# Patient Record
Sex: Male | Born: 1960 | Race: White | Hispanic: No | Marital: Married | Smoking: Former smoker
Health system: Southern US, Community
[De-identification: ages and names within clinical notes are randomized; demographics above are authoritative.]

## PROBLEM LIST (undated history)

## (undated) DIAGNOSIS — C801 Malignant (primary) neoplasm, unspecified: Secondary | ICD-10-CM

## (undated) DIAGNOSIS — I219 Acute myocardial infarction, unspecified: Secondary | ICD-10-CM

## (undated) DIAGNOSIS — E119 Type 2 diabetes mellitus without complications: Secondary | ICD-10-CM

## (undated) DIAGNOSIS — I10 Essential (primary) hypertension: Secondary | ICD-10-CM

## (undated) HISTORY — PX: KIDNEY SURGERY: SHX687

## (undated) HISTORY — PX: KNEE SURGERY: SHX244

## (undated) HISTORY — PX: APPENDECTOMY: SHX54

## (undated) HISTORY — PX: CORONARY ARTERY BYPASS GRAFT: SHX141

---

## 2005-03-25 ENCOUNTER — Ambulatory Visit: Payer: Self-pay | Admitting: Cardiology

## 2005-03-25 ENCOUNTER — Inpatient Hospital Stay (HOSPITAL_COMMUNITY): Admission: AD | Admit: 2005-03-25 | Discharge: 2005-03-27 | Payer: Self-pay | Admitting: Cardiology

## 2005-04-22 ENCOUNTER — Ambulatory Visit: Payer: Self-pay | Admitting: Cardiology

## 2005-06-15 ENCOUNTER — Ambulatory Visit: Payer: Self-pay | Admitting: Cardiology

## 2005-09-09 ENCOUNTER — Ambulatory Visit: Payer: Self-pay | Admitting: Cardiovascular Disease

## 2005-11-11 ENCOUNTER — Inpatient Hospital Stay (HOSPITAL_COMMUNITY): Admission: RE | Admit: 2005-11-11 | Discharge: 2005-11-12 | Payer: Self-pay | Admitting: Cardiology

## 2005-11-11 ENCOUNTER — Ambulatory Visit: Payer: Self-pay | Admitting: Cardiology

## 2005-12-07 ENCOUNTER — Ambulatory Visit: Payer: Self-pay | Admitting: Cardiology

## 2006-03-08 ENCOUNTER — Ambulatory Visit: Payer: Self-pay | Admitting: Cardiovascular Disease

## 2006-03-08 ENCOUNTER — Ambulatory Visit (HOSPITAL_COMMUNITY): Admission: RE | Admit: 2006-03-08 | Discharge: 2006-03-08 | Payer: Self-pay | Admitting: Cardiology

## 2006-03-10 ENCOUNTER — Ambulatory Visit (HOSPITAL_COMMUNITY): Admission: RE | Admit: 2006-03-10 | Discharge: 2006-03-11 | Payer: Self-pay | Admitting: Cardiology

## 2006-03-10 ENCOUNTER — Ambulatory Visit: Payer: Self-pay | Admitting: Cardiology

## 2006-03-28 ENCOUNTER — Ambulatory Visit: Payer: Self-pay | Admitting: Cardiology

## 2006-05-05 ENCOUNTER — Ambulatory Visit: Payer: Self-pay | Admitting: Cardiology

## 2006-05-05 ENCOUNTER — Inpatient Hospital Stay (HOSPITAL_COMMUNITY): Admission: EM | Admit: 2006-05-05 | Discharge: 2006-05-06 | Payer: Self-pay | Admitting: Cardiology

## 2009-12-28 ENCOUNTER — Ambulatory Visit: Payer: Self-pay | Admitting: Surgery

## 2015-03-02 DIAGNOSIS — E785 Hyperlipidemia, unspecified: Secondary | ICD-10-CM | POA: Insufficient documentation

## 2015-03-02 DIAGNOSIS — Z951 Presence of aortocoronary bypass graft: Secondary | ICD-10-CM | POA: Insufficient documentation

## 2015-03-02 DIAGNOSIS — E782 Mixed hyperlipidemia: Secondary | ICD-10-CM | POA: Insufficient documentation

## 2015-03-02 DIAGNOSIS — I214 Non-ST elevation (NSTEMI) myocardial infarction: Secondary | ICD-10-CM | POA: Insufficient documentation

## 2015-03-02 DIAGNOSIS — I2510 Atherosclerotic heart disease of native coronary artery without angina pectoris: Secondary | ICD-10-CM | POA: Insufficient documentation

## 2015-04-30 DIAGNOSIS — I10 Essential (primary) hypertension: Secondary | ICD-10-CM | POA: Insufficient documentation

## 2016-01-28 DIAGNOSIS — G4733 Obstructive sleep apnea (adult) (pediatric): Secondary | ICD-10-CM | POA: Insufficient documentation

## 2016-01-28 DIAGNOSIS — E6609 Other obesity due to excess calories: Secondary | ICD-10-CM | POA: Insufficient documentation

## 2016-11-27 DIAGNOSIS — I200 Unstable angina: Secondary | ICD-10-CM | POA: Insufficient documentation

## 2016-11-27 DIAGNOSIS — E1165 Type 2 diabetes mellitus with hyperglycemia: Secondary | ICD-10-CM | POA: Insufficient documentation

## 2016-11-27 DIAGNOSIS — I209 Angina pectoris, unspecified: Secondary | ICD-10-CM | POA: Insufficient documentation

## 2017-01-04 DIAGNOSIS — K219 Gastro-esophageal reflux disease without esophagitis: Secondary | ICD-10-CM | POA: Insufficient documentation

## 2017-05-01 ENCOUNTER — Ambulatory Visit: Payer: Self-pay | Admitting: Cardiology

## 2018-12-17 ENCOUNTER — Encounter: Payer: Self-pay | Admitting: Emergency Medicine

## 2018-12-17 ENCOUNTER — Encounter: Payer: Self-pay | Admitting: Cardiology

## 2018-12-17 ENCOUNTER — Ambulatory Visit (INDEPENDENT_AMBULATORY_CARE_PROVIDER_SITE_OTHER): Payer: Medicare Other | Admitting: Cardiology

## 2018-12-17 VITALS — BP 112/72 | HR 46 | Ht 67.0 in | Wt 288.6 lb

## 2018-12-17 DIAGNOSIS — I2510 Atherosclerotic heart disease of native coronary artery without angina pectoris: Secondary | ICD-10-CM | POA: Diagnosis not present

## 2018-12-17 DIAGNOSIS — I10 Essential (primary) hypertension: Secondary | ICD-10-CM

## 2018-12-17 DIAGNOSIS — E782 Mixed hyperlipidemia: Secondary | ICD-10-CM

## 2018-12-17 DIAGNOSIS — Z951 Presence of aortocoronary bypass graft: Secondary | ICD-10-CM

## 2018-12-17 NOTE — Progress Notes (Signed)
Cardiology Office Note:    Date:  12/17/2018   ID:  Peter Garrison, DOB [birth year], MRN [id number]  PCP:  Dellia Beckwith, PA-C  Cardiologist:  Jenne Campus, MD    Referring MD: Dellia Beckwith, *   Chief Complaint  Patient presents with  . Follow-up  Doing well  History of Present Illness:    Peter Garrison is a 57 y.o. 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  He is doing well he is working on his weight loss and achieve some success is lost about 20 pounds feeling better and his diabetes is better described to have occasional chest pain when he get upset.  Also he have to pace himself when he is doing things because he get short of breath easily and also sometimes develop chest tightness.  He used to be taking ranolazine but reports the fact that he did not help much.  He did not take nitroglycerin.   No past medical history on file.    Current Medications: Current Meds  Medication Sig  . busPIRone (BUSPAR) 15 MG tablet Take 15 mg by mouth 2 (two) times daily.  . famotidine (PEPCID) 20 MG tablet Take 20 mg by mouth 2 (two) times daily.  . fenofibrate (TRICOR) 48 MG tablet Take 48 mg by mouth daily.  Marland Kitchen glipiZIDE (GLUCOTROL XL) 5 MG 24 hr tablet Take 5 mg by mouth daily.  Marland Kitchen HYDROcodone-acetaminophen (NORCO) 10-325 MG tablet Take 1 tablet by mouth 4 (four) times daily as needed for pain.  Javier Docker Oil 1000 MG CAPS Take 1,000 mg by mouth 2 (two) times daily.  Marland Kitchen lisinopril (PRINIVIL,ZESTRIL) 5 MG tablet Take 5 mg by mouth daily.  Marland Kitchen loratadine (CLARITIN) 10 MG tablet Take 10 mg by mouth daily.  . metoprolol tartrate (LOPRESSOR) 25 MG tablet Take 25 mg by mouth 2 (two) times daily.  . nitroGLYCERIN (NITROSTAT) 0.4 MG SL tablet Place 0.4 mg under the tongue as needed for chest pain.  . rosuvastatin (CRESTOR) 20 MG tablet Take 1 tablet by mouth  daily.  . ticagrelor (BRILINTA) 90 MG TABS tablet Take 90 mg by mouth 2 (two) times daily.     Allergies:   Adhesive [tape]; Percocet [oxycodone-acetaminophen]; and Prednisone   Social History   Socioeconomic History  . Marital status: Married    Spouse name: Not on file  . Number of children: Not on file  . Years of education: Not on file  . Highest education level: Not on file  Occupational History  . Not on file  Social Needs  . Financial resource strain: Not on file  . Food insecurity:    Worry: Not on file    Inability: Not on file  . Transportation needs:    Medical: Not on file    Non-medical: Not on file  Tobacco Use  . Smoking status: Former Research scientist (life sciences)  . Smokeless tobacco: Current User    Types: Snuff  Substance and Sexual Activity  . Alcohol use: Not Currently  . Drug use: Never  . Sexual activity: Not on file  Lifestyle  . Physical activity:    Days per week: Not on file    Minutes per session: Not on file  . Stress: Not on file  Relationships  . Social connections:    Talks on phone: Not on file    Gets together: Not on file  Attends religious service: Not on file    Active member of club or organization: Not on file    Attends meetings of clubs or organizations: Not on file    Relationship status: Not on file  Other Topics Concern  . Not on file  Social History Narrative  . Not on file     Family History: The patient's family history includes Aneurysm in his mother; Cancer in his father; Diabetes in his mother. ROS:   Please see the history of present illness.    All 14 point review of systems negative except as described per history of present illness  EKGs/Labs/Other Studies Reviewed:      Recent Labs: No results found for requested labs within last 8760 hours.  Recent Lipid Panel No results found for: CHOL, TRIG, HDL, CHOLHDL, VLDL, LDLCALC, LDLDIRECT  Physical Exam:    VS:  BP 112/72   Pulse (!) 46   Ht 5\' 7"  (1.702 m)   Wt 288 lb  9.6 oz (130.9 kg)   SpO2 98%   BMI 45.20 kg/m     Wt Readings from Last 3 Encounters:  12/17/18 288 lb 9.6 oz (130.9 kg)     GEN:  Well nourished, well developed in no acute distress HEENT: Normal NECK: No JVD; No carotid bruits LYMPHATICS: No lymphadenopathy CARDIAC: RRR, no murmurs, no rubs, no gallops RESPIRATORY:  Clear to auscultation without rales, wheezing or rhonchi  ABDOMEN: Soft, non-tender, non-distended MUSCULOSKELETAL:  No edema; No deformity  SKIN: Warm and dry LOWER EXTREMITIES: no swelling NEUROLOGIC:  Alert and oriented x 3 PSYCHIATRIC:  Normal affect   ASSESSMENT:    1. S/P CABG (coronary artery bypass graft)   2. Mixed hyperlipidemia   3. Benign essential hypertension   4. Coronary artery disease involving native coronary artery of native heart without angina pectoris    PLAN:    In order of problems listed above:  1. Coronary artery disease status post coronary artery bypass graft still have some symptoms therefore as a part of evaluation I will ask him to have stress test done.  He still taking Brilinta which is more than 2 years after stent implantation ask him to stop it I will put him on Plavix 75.  I will make sure he get nitroglycerin he knows has to use it. 2. Dyslipidemia will call primary care physician to get his fasting lipid profile according to the patient was excellent.  We will get a copy of it 3.  4. Essential hypertension blood pressure well controlled continue present medications. 5. Dyspnea on exertion and fatigue I will ask him to have an echocardiogram to assess left ventricular ejection fraction.  EKG will be done today as well   Medication Adjustments/Labs and Tests Ordered: Current medicines are reviewed at length with the patient today.  Concerns regarding medicines are outlined above.  No orders of the defined types were placed in this encounter.  Medication changes: No orders of the defined types were placed in this  encounter.   Signed, Park Liter, MD, [facility] 12/17/2018 10:20 AM    [place]

## 2018-12-17 NOTE — Patient Instructions (Signed)
Medication Instructions:  Your physician recommends that you continue on your current medications as directed. Please refer to the Current Medication list given to you today.  If you need a refill on your cardiac medications before your next appointment, please call your pharmacy.   Lab work: None.  If you have labs (blood work) drawn today and your tests are completely normal, you will receive your results only by: Marland Kitchen MyChart Message (if you have MyChart) OR . A paper copy in the mail If you have any lab test that is abnormal or we need to change your treatment, we will call you to review the results.  Testing/Procedures: Your physician has requested that you have an echocardiogram. Echocardiography is a painless test that uses sound waves to create images of your heart. It provides your doctor with information about the size and shape of your heart and how well your heart's chambers and valves are working. This procedure takes approximately one hour. There are no restrictions for this procedure.  Your physician has requested that you have en exercise stress myoview. For further information please visit [email or web address]. Please follow instruction sheet, as given.    Follow-Up: At [facility], you and your health needs are our priority.  As part of our continuing mission to provide you with exceptional heart care, we have created designated Provider Care Teams.  These Care Teams include your primary Cardiologist (physician) and Advanced Practice Providers (APPs -  Physician Assistants and Nurse Practitioners) who all work together to provide you with the care you need, when you need it. You will need a follow up appointment in 6 weeks.  Please call our office 2 months in advance to schedule this appointment.  You may see No primary care provider on file. or another member of our [employer] Provider Team in [place]: Shirlee More, MD . Jyl Heinz, MD  Any Other Special  Instructions Will Be Listed Below (If Applicable).   Echocardiogram An echocardiogram is a procedure that uses painless sound waves (ultrasound) to produce an image of the heart. Images from an echocardiogram can provide important information about:  Signs of coronary artery disease (CAD).  Aneurysm detection. An aneurysm is a weak or damaged part of an artery wall that bulges out from the normal force of blood pumping through the body.  Heart size and shape. Changes in the size or shape of the heart can be associated with certain conditions, including heart failure, aneurysm, and CAD.  Heart muscle function.  Heart valve function.  Signs of a past heart attack.  Fluid buildup around the heart.  Thickening of the heart muscle.  A tumor or infectious growth around the heart valves. Tell a health care provider about:  Any allergies you have.  All medicines you are taking, including vitamins, herbs, eye drops, creams, and over-the-counter medicines.  Any blood disorders you have.  Any surgeries you have had.  Any medical conditions you have.  Whether you are pregnant or may be pregnant. What are the risks? Generally, this is a safe procedure. However, problems may occur, including:  Allergic reaction to dye (contrast) that may be used during the procedure. What happens before the procedure? No specific preparation is needed. You may eat and drink normally. What happens during the procedure?   An IV tube may be inserted into one of your veins.  You may receive contrast through this tube. A contrast is an injection that improves the quality of the pictures from your heart.  A gel will be applied to your chest.  A wand-like tool (transducer) will be moved over your chest. The gel will help to transmit the sound waves from the transducer.  The sound waves will harmlessly bounce off of your heart to allow the heart images to be captured in real-time motion. The images  will be recorded on a computer. The procedure may vary among health care providers and hospitals. What happens after the procedure?  You may return to your normal, everyday life, including diet, activities, and medicines, unless your health care provider tells you not to do that. Summary  An echocardiogram is a procedure that uses painless sound waves (ultrasound) to produce an image of the heart.  Images from an echocardiogram can provide important information about the size and shape of your heart, heart muscle function, heart valve function, and fluid buildup around your heart.  You do not need to do anything to prepare before this procedure. You may eat and drink normally.  After the echocardiogram is completed, you may return to your normal, everyday life, unless your health care provider tells you not to do that. This information is not intended to replace advice given to you by your health care provider. Make sure you discuss any questions you have with your health care provider. Document Released: 09/23/2000 Document Revised: 10/29/2016 Document Reviewed: 10/29/2016 Elsevier Interactive Patient Education  2019 [employer].   Cardiac Nuclear Scan A cardiac nuclear scan is a test that measures blood flow to the heart when a person is resting and when he or she is exercising. The test looks for problems such as:  Not enough blood reaching a portion of the heart.  The heart muscle not working normally. You may need this test if:  You have heart disease.  You have had abnormal lab results.  You have had heart surgery or a balloon procedure to open up blocked arteries (angioplasty).  You have chest pain.  You have shortness of breath. In this test, a radioactive dye (tracer) is injected into your bloodstream. After the tracer has traveled to your heart, an imaging device is used to measure how much of the tracer is absorbed by or distributed to various areas of your heart.  This procedure is usually done at a hospital and takes 2-4 hours. Tell a health care provider about:  Any allergies you have.  All medicines you are taking, including vitamins, herbs, eye drops, creams, and over-the-counter medicines.  Any problems you or family members have had with anesthetic medicines.  Any blood disorders you have.  Any surgeries you have had.  Any medical conditions you have.  Whether you are pregnant or may be pregnant. What are the risks? Generally, this is a safe procedure. However, problems may occur, including:  Serious chest pain and heart attack. This is only a risk if the stress portion of the test is done.  Rapid heartbeat.  Sensation of warmth in your chest. This usually passes quickly.  Allergic reaction to the tracer. What happens before the procedure?  Ask your health care provider about changing or stopping your regular medicines. This is especially important if you are taking diabetes medicines or blood thinners.  Follow instructions from your health care provider about eating or drinking restrictions.  Remove your jewelry on the day of the procedure. What happens during the procedure?  An IV will be inserted into one of your veins.  Your health care provider will inject a small amount of radioactive  tracer through the IV.  You will wait for 20-40 minutes while the tracer travels through your bloodstream.  Your heart activity will be monitored with an electrocardiogram (ECG).  You will lie down on an exam table.  Images of your heart will be taken for about 15-20 minutes.  You may also have a stress test. For this test, one of the following may be done: ? You will exercise on a treadmill or stationary bike. While you exercise, your heart's activity will be monitored with an ECG, and your blood pressure will be checked. ? You will be given medicines that will increase blood flow to parts of your heart. This is done if you are unable  to exercise.  When blood flow to your heart has peaked, a tracer will again be injected through the IV.  After 20-40 minutes, you will get back on the exam table and have more images taken of your heart.  Depending on the type of tracer used, scans may need to be repeated 3-4 hours later.  Your IV line will be removed when the procedure is over. The procedure may vary among health care providers and hospitals. What happens after the procedure?  Unless your health care provider tells you otherwise, you may return to your normal schedule, including diet, activities, and medicines.  Unless your health care provider tells you otherwise, you may increase your fluid intake. This will help to flush the contrast dye from your body. Drink enough fluid to keep your urine pale yellow.  Ask your health care provider, or the department that is doing the test: ? When will my results be ready? ? How will I get my results? Summary  A cardiac nuclear scan measures the blood flow to the heart when a person is resting and when he or she is exercising.  Tell your health care provider if you are pregnant.  Before the procedure, ask your health care provider about changing or stopping your regular medicines. This is especially important if you are taking diabetes medicines or blood thinners.  After the procedure, unless your health care provider tells you otherwise, increase your fluid intake. This will help flush the contrast dye from your body.  After the procedure, unless your health care provider tells you otherwise, you may return to your normal schedule, including diet, activities, and medicines. This information is not intended to replace advice given to you by your health care provider. Make sure you discuss any questions you have with your health care provider. Document Released: 10/21/2004 Document Revised: 03/12/2018 Document Reviewed: 03/12/2018 Elsevier Interactive Patient Education  2019  [employer].

## 2019-01-03 ENCOUNTER — Telehealth: Payer: Self-pay | Admitting: Cardiology

## 2019-01-03 ENCOUNTER — Telehealth: Payer: Self-pay | Admitting: *Deleted

## 2019-01-03 NOTE — Telephone Encounter (Signed)
Patient called and state that wife should be added to HIPPA form which he did not do when he was in the office on original appt date. Imailed a copy of the DPR to patient int he mail and he will send back to [place] for updating.

## 2019-01-03 NOTE — Telephone Encounter (Signed)
Cancelled nuclear appt 

## 2019-01-15 ENCOUNTER — Ambulatory Visit: Payer: Medicare Other

## 2019-01-16 ENCOUNTER — Ambulatory Visit: Payer: Medicare Other

## 2019-01-17 ENCOUNTER — Other Ambulatory Visit: Payer: Medicare Other

## 2019-01-22 NOTE — Telephone Encounter (Signed)
2 day Lexiscan placed into recall

## 2019-01-28 ENCOUNTER — Telehealth: Payer: Self-pay | Admitting: Cardiology

## 2019-01-28 NOTE — Telephone Encounter (Signed)
Virtual Visit Pre-Appointment Phone Call  Steps For Call:  1. Confirm consent - "In the setting of the current Covid19 crisis, you are scheduled for a (phone or video) visit with your provider on (date) at (time).  Just as we do with many in-office visits, in order for you to participate in this visit, we must obtain consent.  If you'd like, I can send this to your mychart (if signed up) or email for you to review.  Otherwise, I can obtain your verbal consent now.  All virtual visits are billed to your insurance company just like a normal visit would be.  By agreeing to a virtual visit, we'd like you to understand that the technology does not allow for your provider to perform an examination, and thus may limit your provider's ability to fully assess your condition. If your provider identifies any concerns that need to be evaluated in person, we will make arrangements to do so.  Finally, though the technology is pretty good, we cannot assure that it will always work on either your or our end, and in the setting of a video visit, we may have to convert it to a phone-only visit.  In either situation, we cannot ensure that we have a secure connection.  Are you willing to proceed?" STAFF: Did the patient verbally acknowledge consent to telehealth visit? Document YES/NO here: YES  2. Confirm the BEST phone number to call the day of the visit by including in appointment notes  3. Give patient instructions for WebEx/MyChart download to smartphone as below or Doximity/Doxy.me if video visit (depending on what platform provider is using)  4. Advise patient to be prepared with their blood pressure, heart rate, weight, any heart rhythm information, their current medicines, and a piece of paper and pen handy for any instructions they may receive the day of their visit  5. Inform patient they will receive a phone call 15 minutes prior to their appointment time (may be from unknown caller ID) so they should be  prepared to answer  6. Confirm that appointment type is correct in Epic appointment notes (VIDEO vs PHONE)     TELEPHONE CALL NOTE  Peter Garrison has been deemed a candidate for a follow-up tele-health visit to limit community exposure during the Covid-19 pandemic. I spoke with the patient via phone to ensure availability of phone/video source, confirm preferred email & phone number, and discuss instructions and expectations.  I reminded Peter Garrison to be prepared with any vital sign and/or heart rhythm information that could potentially be obtained via home monitoring, at the time of his visit. I reminded Peter Garrison to expect a phone call at the time of his visit if his visit.  Isaiah Blakes 01/28/2019 10:04 AM   INSTRUCTIONS FOR DOWNLOADING THE WEBEX APP TO SMARTPHONE  - If Apple, ask patient to go to [employer] and type in WebEx in the search bar. [place] [employer], the blue/green circle. If Android, go to [employer] and type in [employer] in the search bar. The app is free but as with any other app downloads, their phone may require them to verify saved payment information or Apple/Android password.  - The patient does NOT have to create an account. - On the day of the visit, the assist will walk the patient through joining the meeting with the meeting number/password.  INSTRUCTIONS FOR DOWNLOADING THE MYCHART APP TO SMARTPHONE  - The patient must first make sure to have  activated MyChart and know their login information - If Apple, go to [employer] and type in MyChart in the search bar and download the app. If Android, ask patient to go to [employer] and type in [place] in the search bar and download the app. The app is free but as with any other app downloads, their phone may require them to verify saved payment information or Apple/Android password.  - The patient will need to then log into the app with their MyChart username and password, and select [facility] as their healthcare provider to link the account. When it is time for your visit, go to the MyChart app, find appointments, and click Begin Video Visit. Be sure to Select Allow for your device to access the Microphone and Camera for your visit. You will then be connected, and your provider will be with you shortly.  **If they have any issues connecting, or need assistance please contact MyChart service desk [phone])**  **If using a computer, in order to ensure the best quality for their visit they will need to use either of the following Internet Browsers: [employer], or Google Chrome**  IF USING DOXIMITY or DOXY.ME - The patient will receive a link just prior to their visit, either by text or email (to be determined day of appointment depending on if it's doxy.me or Doximity).     FULL LENGTH CONSENT FOR TELE-HEALTH VISIT   I hereby voluntarily request, consent and authorize [place] and its employed or contracted physicians, physician assistants, nurse practitioners or other licensed health care professionals (the Practitioner), to provide me with telemedicine health care services (the Services") as deemed necessary by the treating Practitioner. I acknowledge and consent to receive the Services by the Practitioner via telemedicine. I understand that the telemedicine visit will involve communicating with the Practitioner through live audiovisual communication technology and the disclosure of certain medical information by electronic transmission. I acknowledge that I have been given the opportunity to request an in-person assessment or other available alternative prior to the telemedicine visit and am voluntarily participating in the telemedicine visit.  I understand that I have the right to withhold or withdraw my consent to the use of telemedicine in the course of my care at any time, without affecting my right to future care or treatment, and that the  Practitioner or I may terminate the telemedicine visit at any time. I understand that I have the right to inspect all information obtained and/or recorded in the course of the telemedicine visit and may receive copies of available information for a reasonable fee.  I understand that some of the potential risks of receiving the Services via telemedicine include:   Delay or interruption in medical evaluation due to technological equipment failure or disruption;  Information transmitted may not be sufficient (e.g. poor resolution of images) to allow for appropriate medical decision making by the Practitioner; and/or   In rare instances, security protocols could fail, causing a breach of personal health information.  Furthermore, I acknowledge that it is my responsibility to provide information about my medical history, conditions and care that is complete and accurate to the best of my ability. I acknowledge that Practitioner's advice, recommendations, and/or decision may be based on factors not within their control, such as incomplete or inaccurate data provided by me or distortions of diagnostic images or specimens that may result from electronic transmissions. I understand that the practice of medicine is not an exact science and that  Practitioner makes no warranties or guarantees regarding treatment outcomes. I acknowledge that I will receive a copy of this consent concurrently upon execution via email to the email address I last provided but may also request a printed copy by calling the office of [place].    I understand that my insurance will be billed for this visit.   I have read or had this consent read to me.  I understand the contents of this consent, which adequately explains the benefits and risks of the Services being provided via telemedicine.   I have been provided ample opportunity to ask questions regarding this consent and the Services and have had my questions answered to my  satisfaction.  I give my informed consent for the services to be provided through the use of telemedicine in my medical care  By participating in this telemedicine visit I agree to the above.

## 2019-02-04 ENCOUNTER — Other Ambulatory Visit: Payer: Self-pay

## 2019-02-04 ENCOUNTER — Telehealth (INDEPENDENT_AMBULATORY_CARE_PROVIDER_SITE_OTHER): Payer: Medicare Other | Admitting: Cardiology

## 2019-02-04 ENCOUNTER — Telehealth: Payer: Self-pay | Admitting: Cardiology

## 2019-02-04 ENCOUNTER — Encounter: Payer: Self-pay | Admitting: Cardiology

## 2019-02-04 DIAGNOSIS — I2510 Atherosclerotic heart disease of native coronary artery without angina pectoris: Secondary | ICD-10-CM

## 2019-02-04 DIAGNOSIS — Z951 Presence of aortocoronary bypass graft: Secondary | ICD-10-CM

## 2019-02-04 DIAGNOSIS — E782 Mixed hyperlipidemia: Secondary | ICD-10-CM

## 2019-02-04 DIAGNOSIS — E1165 Type 2 diabetes mellitus with hyperglycemia: Secondary | ICD-10-CM

## 2019-02-04 DIAGNOSIS — I10 Essential (primary) hypertension: Secondary | ICD-10-CM

## 2019-02-04 NOTE — Progress Notes (Signed)
Virtual Visit via Video Note   This visit type was conducted due to national recommendations for restrictions regarding the COVID-19 Pandemic (e.g. social distancing) in an effort to limit this patient's exposure and mitigate transmission in our community.  Due to his co-morbid illnesses, this patient is at least at moderate risk for complications without adequate follow up.  This format is felt to be most appropriate for this patient at this time.  All issues noted in this document were discussed and addressed.  A limited physical exam was performed with this format.  Please refer to the patient's chart for his consent to telehealth for [facility].  Evaluation Performed:  Follow-up visit  This visit type was conducted due to national recommendations for restrictions regarding the COVID-19 Pandemic (e.g. social distancing).  This format is felt to be most appropriate for this patient at this time.  All issues noted in this document were discussed and addressed.  No physical exam was performed (except for noted visual exam findings with Video Visits).  Please refer to the patient's chart (MyChart message for video visits and phone note for telephone visits) for the patient's consent to telehealth for [facility].  Date:  02/04/2019  ID: Peter Garrison, DOB [birth year], MRN [id number]   Patient Location: [place] [address]   Provider location:   [place] [facility]  PCP:  Dellia Beckwith, PA-C  Cardiologist:  Jenne Campus, MD     Chief Complaint: Doing well  History of Present Illness:    Peter Garrison is a 58 y.o. male  who presents via audio/video conferencing for a telehealth visit today.  With coronary disease, status post remote coronary artery bypass graft, dyslipidemia, diabetes.  We having video visit today to talk about his problem overall he is doing well he sits at home majority of time.  He is keeping restriction of coronavirus.  He  described to have some fatigue and tiredness while walking.  But was able to cut grass using a riding lawnmower last week and he had no difficulty doing it.  No chest pain no tightness no pressure no burning no squeezing in the chest no palpitations no dizziness.   The patient does not have symptoms concerning for COVID-19 infection (fever, chills, cough, or new SHORTNESS OF BREATH).    Prior CV studies:   The following studies were reviewed today:       No past medical history on file.     Current Meds  Medication Sig  . busPIRone (BUSPAR) 15 MG tablet Take 15 mg by mouth 2 (two) times daily.  . famotidine (PEPCID) 20 MG tablet Take 20 mg by mouth 2 (two) times daily.  . fenofibrate (TRICOR) 48 MG tablet Take 48 mg by mouth daily.  Marland Kitchen glipiZIDE (GLUCOTROL XL) 5 MG 24 hr tablet Take 5 mg by mouth daily.  Marland Kitchen HYDROcodone-acetaminophen (NORCO) 10-325 MG tablet Take 1 tablet by mouth 4 (four) times daily as needed for pain.  Javier Docker Oil 1000 MG CAPS Take 1,000 mg by mouth 2 (two) times daily.  Marland Kitchen lisinopril (PRINIVIL,ZESTRIL) 5 MG tablet Take 5 mg by mouth daily.  Marland Kitchen loratadine (CLARITIN) 10 MG tablet Take 10 mg by mouth daily.  . metoprolol tartrate (LOPRESSOR) 25 MG tablet Take 25 mg by mouth 2 (two) times daily.  . nitroGLYCERIN (NITROSTAT) 0.4 MG SL tablet Place 0.4 mg under the tongue as needed for chest pain.  . rosuvastatin (CRESTOR) 20 MG tablet  Take 1 tablet by mouth daily.  . ticagrelor (BRILINTA) 90 MG TABS tablet Take 90 mg by mouth 2 (two) times daily.      Family History: The patient's family history includes Aneurysm in his mother; Cancer in his father; Diabetes in his mother.   ROS:   Please see the history of present illness.     All other systems reviewed and are negative.   Labs/Other Tests and Data Reviewed:     Recent Labs: No results found for requested labs within last 8760 hours.  Recent Lipid Panel No results found for: CHOL, TRIG, HDL, CHOLHDL,  VLDL, LDLCALC, LDLDIRECT    Exam:    Vital Signs:  There were no vitals taken for this visit.    Wt Readings from Last 3 Encounters:  12/17/18 288 lb 9.6 oz (130.9 kg)     Well nourished, well developed in no acute distress. Alert awake oriented x3.  Talking to me over a video link he sitting in his living room.  Happy to be able to talk to me not in any distress no JVD no swelling of lower extremities  Diagnosis for this visit:   1. S/P CABG (coronary artery bypass graft)   2. Mixed hyperlipidemia   3. Type 2 diabetes mellitus with hyperglycemia, without long-term current use of insulin ([place])   4. Benign essential hypertension   5. Coronary artery disease involving native coronary artery of native heart without angina pectoris   6.  Coronary artery disease.  Noted.  Plan as outlined above   ASSESSMENT & PLAN:    1.  Status post coronary artery bypass graft looking good from that point review he supposed to have a stress test done because of exertional shortness of breath as well as echocardiogram.  I waiting for coronavirus situation to stabilize before those tests will be done. 2.  Mixed dyslipidemia he is taking Crestor 20 which I will continue.  In the future we will recheck his fasting lipid profile. 3.  Type 2 diabetes stable followed by internal medicine team. 4.  Benign essential hypertension blood pressure well controlled with appropriate medication which I will continue.  COVID-19 Education: The signs and symptoms of COVID-19 were discussed with the patient and how to seek care for testing (follow up with PCP or arrange E-visit).  The importance of social distancing was discussed today.  Patient Risk:   After full review of this patients clinical status, I feel that they are at least moderate risk at this time.  Time:   Today, I have spent 14 minutes with the patient with telehealth technology discussing pt health issues.  I spent 5 minutes reviewing her chart before  the visit.  Visit was finished at 11:25 AM.    Medication Adjustments/Labs and Tests Ordered: Current medicines are reviewed at length with the patient today.  Concerns regarding medicines are outlined above.  No orders of the defined types were placed in this encounter.  Medication changes: No orders of the defined types were placed in this encounter.    Disposition: Follow-up in 3 months.  Echocardiogram and stress test will be put in acuity level 2  Signed, Park Liter, MD, [facility] 02/04/2019 11:24 AM    [place]

## 2019-02-04 NOTE — Telephone Encounter (Signed)

## 2019-02-04 NOTE — Patient Instructions (Addendum)
Medication Instructions:  Your physician recommends that you continue on your current medications as directed. Please refer to the Current Medication list given to you today.  If you need a refill on your cardiac medications before your next appointment, please call your pharmacy.   Lab work: None If you have labs (blood work) drawn today and your tests are completely normal, you will receive your results only by:  [place] (if you have MyChart) OR  A paper copy in the mail If you have any lab test that is abnormal or we need to change your treatment, we will call you to review the results.  Testing/Procedures: Your physician has requested that you have an echocardiogram. Echocardiography is a painless test that uses sound waves to create images of your heart. It provides your doctor with information about the size and shape of your heart and how well your hearts chambers and valves are working. This procedure takes approximately one hour. There are no restrictions for this procedure.  Your physician has requested that you have a stress echocardiogram. For further information please visit [email or web address]. Please follow instruction sheet as given. DO NOT TAKE GLIPIZIDE OR METOPROLOL THE DAY OF THE TEST DO NOT EAT OR DRINK OR CONSUME TOBACCO PRODUCTS 4 HOURS PRIOR TO TEST DRESS PREPARED TO EXERCISE IN COMFORTABLE 2 PIECE CLOTHING AND WALKING SHOES BRING CURRENT MEDS TO THE APPOINTMENT NOTIFY THE OFFICE 24 HOURS IN ADVANCE IF UNABLE TO MAKE APPOINTMENT  Follow-Up: At [facility], you and your health needs are our priority.  As part of our continuing mission to provide you with exceptional heart care, we have created designated Provider Care Teams.  These Care Teams include your primary Cardiologist (physician) and Advanced Practice Providers (APPs -  Physician Assistants and Nurse Practitioners) who all work together to provide you with the care you need, when you need it. You  will need a follow up appointment in 3 months.  Any Other Special Instructions Will Be Listed Below (If Applicable).   Echocardiogram An echocardiogram is a procedure that uses painless sound waves (ultrasound) to produce an image of the heart. Images from an echocardiogram can provide important information about:  Signs of coronary artery disease (CAD).  Aneurysm detection. An aneurysm is a weak or damaged part of an artery wall that bulges out from the normal force of blood pumping through the body.  Heart size and shape. Changes in the size or shape of the heart can be associated with certain conditions, including heart failure, aneurysm, and CAD.  Heart muscle function.  Heart valve function.  Signs of a past heart attack.  Fluid buildup around the heart.  Thickening of the heart muscle.  A tumor or infectious growth around the heart valves. Tell a health care provider about:  Any allergies you have.  All medicines you are taking, including vitamins, herbs, eye drops, creams, and over-the-counter medicines.  Any blood disorders you have.  Any surgeries you have had.  Any medical conditions you have.  Whether you are pregnant or may be pregnant. What are the risks? Generally, this is a safe procedure. However, problems may occur, including:  Allergic reaction to dye (contrast) that may be used during the procedure. What happens before the procedure? No specific preparation is needed. You may eat and drink normally. What happens during the procedure?   An IV tube may be inserted into one of your veins.  You may receive contrast through this tube. A contrast is an injection that  improves the quality of the pictures from your heart.  A gel will be applied to your chest.  A wand-like tool (transducer) will be moved over your chest. The gel will help to transmit the sound waves from the transducer.  The sound waves will harmlessly bounce off of your heart to allow  the heart images to be captured in real-time motion. The images will be recorded on a computer. The procedure may vary among health care providers and hospitals. What happens after the procedure?  You may return to your normal, everyday life, including diet, activities, and medicines, unless your health care provider tells you not to do that. Summary  An echocardiogram is a procedure that uses painless sound waves (ultrasound) to produce an image of the heart.  Images from an echocardiogram can provide important information about the size and shape of your heart, heart muscle function, heart valve function, and fluid buildup around your heart.  You do not need to do anything to prepare before this procedure. You may eat and drink normally.  After the echocardiogram is completed, you may return to your normal, everyday life, unless your health care provider tells you not to do that. This information is not intended to replace advice given to you by your health care provider. Make sure you discuss any questions you have with your health care provider. Document Released: 09/23/2000 Document Revised: 10/29/2016 Document Reviewed: 10/29/2016 Elsevier Interactive Patient Education  2019 [employer].      Exercise Stress Echocardiogram  An exercise stress echocardiogram is a test to check how well your heart is working. This test uses sound waves (ultrasound) and a computer to make images of your heart before and after exercise. Ultrasound images that are taken before you exercise (your resting echocardiogram) will show how much blood is getting to your heart muscle and how well your heart muscle and heart valves are functioning. During the next part of this test, you will walk on a treadmill or ride a stationary bike to see how exercise affects your heart. While you exercise, the electrical activity of your heart will be monitored with an electrocardiogram (ECG). Your blood pressure will also  be monitored. You may have this test if you:  Have chest pain or other symptoms of a heart problem.  Recently had a heart attack or heart surgery.  Have heart valve problems.  Have a condition that causes narrowing of the blood vessels that supply your heart (coronary artery disease).  Have a high risk of heart disease and are starting a new exercise program.  Have a high risk of heart disease and need to have major surgery. Tell a health care provider about:  Any allergies you have.  All medicines you are taking, including vitamins, herbs, eye drops, creams, and over-the-counter medicines.  Any problems you or family members have had with anesthetic medicines.  Any blood disorders you have.  Any surgeries you have had.  Any medical conditions you have.  Whether you are pregnant or may be pregnant. What are the risks? Generally, this is a safe procedure. However, problems may occur, including:  Chest pain.  Dizziness or light-headedness.  Shortness of breath.  Increased or irregular heartbeat (palpitations).  Nausea or vomiting.  Heart attack (very rare). What happens before the procedure?  Follow instructions from your health care provider about eating or drinking restrictions. You may be asked to avoid all forms of caffeine for 24 hours before your procedure, or as told by your  health care provider.  Ask your health care provider about changing or stopping your regular medicines. This is especially important if you are taking diabetes medicines or blood thinners.  If you use an inhaler, bring it with you to the test.  Wear loose, comfortable clothing and walking shoes.  Do notuse any products that contain nicotine or tobacco, such as cigarettes and e-cigarettes, for 4 hours before the test or as told by your health care provider. If you need help quitting, ask your health care provider. What happens during the procedure?  You will take off your clothes from  the waist up and put on a hospital gown.  A technician will place electrodes on your chest.  A blood pressure cuff will be placed on your arm.  You will lie down on a table for an ultrasound exam before you exercise. Gel will be rubbed on your chest, and a handheld device (transducer) will be pressed against your chest and moved over your heart.  Then, you will start exercising by walking on a treadmill or pedaling a stationary bicycle.  Your blood pressure and heart rhythm will be monitored while you exercise.  The exercise will gradually get harder or faster.  You will exercise until: ? Your heart reaches a target level. ? You are too tired to continue. ? You cannot continue because of chest pain, weakness, or dizziness.  You will have another ultrasound exam after you stop exercising. The procedure may vary among health care providers and hospitals. What happens after the procedure?  Your heart rate and blood pressure will be monitored until they return to your normal levels. Summary  An exercise stress echocardiogram is a test that uses ultrasound to check how well your heart works before and after exercise.  Before the test, follow instructions from your health care provider about stopping medications, avoiding nicotine and tobacco, and avoiding certain foods and drinks.  During the test, your blood pressure and heart rhythm will be monitored while you exercise on a treadmill or stationary bicycle. This information is not intended to replace advice given to you by your health care provider. Make sure you discuss any questions you have with your health care provider. Document Released: 09/30/2004 Document Revised: 05/18/2016 Document Reviewed: 05/18/2016 Elsevier Interactive Patient Education  2019 [employer].

## 2019-05-08 ENCOUNTER — Encounter: Payer: Self-pay | Admitting: Cardiology

## 2019-05-08 ENCOUNTER — Telehealth (INDEPENDENT_AMBULATORY_CARE_PROVIDER_SITE_OTHER): Payer: Medicare Other | Admitting: Cardiology

## 2019-05-08 ENCOUNTER — Other Ambulatory Visit: Payer: Self-pay

## 2019-05-08 VITALS — Wt 304.0 lb

## 2019-05-08 DIAGNOSIS — I10 Essential (primary) hypertension: Secondary | ICD-10-CM

## 2019-05-08 DIAGNOSIS — E782 Mixed hyperlipidemia: Secondary | ICD-10-CM

## 2019-05-08 DIAGNOSIS — Z951 Presence of aortocoronary bypass graft: Secondary | ICD-10-CM | POA: Diagnosis not present

## 2019-05-08 DIAGNOSIS — G4733 Obstructive sleep apnea (adult) (pediatric): Secondary | ICD-10-CM

## 2019-05-08 NOTE — Patient Instructions (Signed)
Medication Instructions:  Your physician recommends that you continue on your current medications as directed. Please refer to the Current Medication list given to you today.  If you need a refill on your cardiac medications before your next appointment, please call your pharmacy.   Lab work: None.  If you have labs (blood work) drawn today and your tests are completely normal, you will receive your results only by: Marland Kitchen MyChart Message (if you have MyChart) OR . A paper copy in the mail If you have any lab test that is abnormal or we need to change your treatment, we will call you to review the results.  Testing/Procedures: Your physician has requested that you have an echocardiogram. Echocardiography is a painless test that uses sound waves to create images of your heart. It provides your doctor with information about the size and shape of your heart and how well your heart's chambers and valves are working. This procedure takes approximately one hour. There are no restrictions for this procedure.  Your physician has requested that you have a lexiscan myoview. For further information please visit [email or web address]. Please follow instruction sheet, as given.    [facility] [address] [place], [place] [address] Phone:  [phone]  May 08, 2019    Peter Garrison DOB: 09-Jan-1961 MRN: [id number] [place] [id number]   Dear Peter Garrison, Garrison are scheduled for a Myocardial Perfusion Imaging Study on:  06/18/2019 at 1 : 15 .  Please arrive 15 minutes prior to your appointment time for registration and insurance purposes.  The test will take approximately 3 to 4 hours to complete; you may bring reading material.  If someone comes with you to your appointment, they will need to remain in the main lobby due to limited space in the testing area. **If you are pregnant or breastfeeding, please notify the nuclear lab prior to your appointment**  How  to prepare for your Myocardial Perfusion Test: . Do not eat or drink 3 hours prior to your test, except you may have water. . Do not consume products containing caffeine (regular or decaffeinated) 12 hours prior to your test. (ex: coffee, chocolate, sodas, tea). Do bring a list of your current medications with you.  If not listed below, you may take your medications as normal. . Do not take metoprolol (Lopressor, Toprol) for 24 hours prior to the test.  Bring the medication to your appointment as you may be required to take it once the test is complete. . Hold glipizide and jardiance the day of the test.  . Do wear comfortable clothes (no dresses or overalls) and walking shoes, tennis shoes preferred (No heels or open toe shoes are allowed). . Do NOT wear cologne, perfume, aftershave, or lotions (deodorant is allowed). . If these instructions are not followed, your test will have to be rescheduled.  Please report to [address] for your test.  If you have questions or concerns about your appointment, you can call the [facility] at [phone].  If you cannot keep your appointment, please provide 24 hours notification to the Nuclear Lab, to avoid a possible $50 charge to your account.  Follow-Up: At [facility], you and your health needs are our priority.  As part of our continuing mission to provide you with exceptional heart care, we have created designated Provider Care Teams.  These Care Teams include your primary Cardiologist (physician) and Advanced Practice Providers (APPs -  Physician Assistants and Nurse Practitioners) who all work together to provide you with the care you need, when you need it. You will need a follow up appointment in 3 months.  Please call our office 2 months in advance to schedule this appointment.  You may see No primary care provider on file. or another member of our [employer] Provider Team in [place]: Shirlee More,  MD . Jyl Heinz, MD  Any Other Special Instructions Will Be Listed Below (If Applicable).   Echocardiogram An echocardiogram is a procedure that uses painless sound waves (ultrasound) to produce an image of the heart. Images from an echocardiogram can provide important information about:  Signs of coronary artery disease (CAD).  Aneurysm detection. An aneurysm is a weak or damaged part of an artery wall that bulges out from the normal force of blood pumping through the body.  Heart size and shape. Changes in the size or shape of the heart can be associated with certain conditions, including heart failure, aneurysm, and CAD.  Heart muscle function.  Heart valve function.  Signs of a past heart attack.  Fluid buildup around the heart.  Thickening of the heart muscle.  A tumor or infectious growth around the heart valves. Tell a health care provider about:  Any allergies you have.  All medicines you are taking, including vitamins, herbs, eye drops, creams, and over-the-counter medicines.  Any blood disorders you have.  Any surgeries you have had.  Any medical conditions you have.  Whether you are pregnant or may be pregnant. What are the risks? Generally, this is a safe procedure. However, problems may occur, including:  Allergic reaction to dye (contrast) that may be used during the procedure. What happens before the procedure? No specific preparation is needed. You may eat and drink normally. What happens during the procedure?   An IV tube may be inserted into one of your veins.  You may receive contrast through this tube. A contrast is an injection that improves the quality of the pictures from your heart.  A gel will be applied to your chest.  A wand-like tool (transducer) will be moved over your chest. The gel will help to transmit the sound waves from the transducer.  The sound waves will harmlessly bounce off of your heart to allow the heart images to  be captured in real-time motion. The images will be recorded on a computer. The procedure may vary among health care providers and hospitals. What happens after the procedure?  You may return to your normal, everyday life, including diet, activities, and medicines, unless your health care provider tells you not to do that. Summary  An echocardiogram is a procedure that uses painless sound waves (ultrasound) to produce an image of the heart.  Images from an echocardiogram can provide important information about the size and shape of your heart, heart muscle function, heart valve function, and fluid buildup around your heart.  You do not need to do anything to prepare before this procedure. You may eat and drink normally.  After the echocardiogram is completed, you may return to your normal, everyday life, unless your health care provider tells you not to do that. This information is not intended to replace advice given to you by your health care provider. Make sure you discuss any questions you have with your health care provider. Document Released: 09/23/2000 Document Revised: 01/17/2019 Document Reviewed: 10/29/2016 Elsevier Patient Education  [place].   Cardiac Nuclear Scan A cardiac nuclear scan  is a test that measures blood flow to the heart when a person is resting and when he or she is exercising. The test looks for problems such as:  Not enough blood reaching a portion of the heart.  The heart muscle not working normally. You may need this test if:  You have heart disease.  You have had abnormal lab results.  You have had heart surgery or a balloon procedure to open up blocked arteries (angioplasty).  You have chest pain.  You have shortness of breath. In this test, a radioactive dye (tracer) is injected into your bloodstream. After the tracer has traveled to your heart, an imaging device is used to measure how much of the tracer is absorbed by or distributed to  various areas of your heart. This procedure is usually done at a hospital and takes 2-4 hours. Tell a health care provider about:  Any allergies you have.  All medicines you are taking, including vitamins, herbs, eye drops, creams, and over-the-counter medicines.  Any problems you or family members have had with anesthetic medicines.  Any blood disorders you have.  Any surgeries you have had.  Any medical conditions you have.  Whether you are pregnant or may be pregnant. What are the risks? Generally, this is a safe procedure. However, problems may occur, including:  Serious chest pain and heart attack. This is only a risk if the stress portion of the test is done.  Rapid heartbeat.  Sensation of warmth in your chest. This usually passes quickly.  Allergic reaction to the tracer. What happens before the procedure?  Ask your health care provider about changing or stopping your regular medicines. This is especially important if you are taking diabetes medicines or blood thinners.  Follow instructions from your health care provider about eating or drinking restrictions.  Remove your jewelry on the day of the procedure. What happens during the procedure?  An IV will be inserted into one of your veins.  Your health care provider will inject a small amount of radioactive tracer through the IV.  You will wait for 20-40 minutes while the tracer travels through your bloodstream.  Your heart activity will be monitored with an electrocardiogram (ECG).  You will lie down on an exam table.  Images of your heart will be taken for about 15-20 minutes.  You may also have a stress test. For this test, one of the following may be done: ? You will exercise on a treadmill or stationary bike. While you exercise, your heart's activity will be monitored with an ECG, and your blood pressure will be checked. ? You will be given medicines that will increase blood flow to parts of your heart.  This is done if you are unable to exercise.  When blood flow to your heart has peaked, a tracer will again be injected through the IV.  After 20-40 minutes, you will get back on the exam table and have more images taken of your heart.  Depending on the type of tracer used, scans may need to be repeated 3-4 hours later.  Your IV line will be removed when the procedure is over. The procedure may vary among health care providers and hospitals. What happens after the procedure?  Unless your health care provider tells you otherwise, you may return to your normal schedule, including diet, activities, and medicines.  Unless your health care provider tells you otherwise, you may increase your fluid intake. This will help to flush the contrast dye from  your body. Drink enough fluid to keep your urine pale yellow.  Ask your health care provider, or the department that is doing the test: ? When will my results be ready? ? How will I get my results? Summary  A cardiac nuclear scan measures the blood flow to the heart when a person is resting and when he or she is exercising.  Tell your health care provider if you are pregnant.  Before the procedure, ask your health care provider about changing or stopping your regular medicines. This is especially important if you are taking diabetes medicines or blood thinners.  After the procedure, unless your health care provider tells you otherwise, increase your fluid intake. This will help flush the contrast dye from your body.  After the procedure, unless your health care provider tells you otherwise, you may return to your normal schedule, including diet, activities, and medicines. This information is not intended to replace advice given to you by your health care provider. Make sure you discuss any questions you have with your health care provider. Document Released: 10/21/2004 Document Revised: 03/12/2018 Document Reviewed: 03/12/2018 Elsevier Patient  Education  2020 [employer].

## 2019-05-08 NOTE — Progress Notes (Signed)
Virtual Visit via Video Note   This visit type was conducted due to national recommendations for restrictions regarding the COVID-19 Pandemic (e.g. social distancing) in an effort to limit this patient's exposure and mitigate transmission in our community.  Due to his co-morbid illnesses, this patient is at least at moderate risk for complications without adequate follow up.  This format is felt to be most appropriate for this patient at this time.  All issues noted in this document were discussed and addressed.  A limited physical exam was performed with this format.  Please refer to the patient's chart for his consent to telehealth for [facility].  Evaluation Performed:  Follow-up visit  This visit type was conducted due to national recommendations for restrictions regarding the COVID-19 Pandemic (e.g. social distancing).  This format is felt to be most appropriate for this patient at this time.  All issues noted in this document were discussed and addressed.  No physical exam was performed (except for noted visual exam findings with Video Visits).  Please refer to the patient's chart (MyChart message for video visits and phone note for telephone visits) for the patient's consent to telehealth for [facility].  Date:  05/08/2019  ID: Peter Garrison, DOB 09/27/1961, MRN [id number]   Patient Location: [place] [address]   Provider location:   [place] Office  PCP:  Dellia Beckwith, PA-C  Cardiologist:  Jenne Campus, MD     Chief Complaint: Doing well  History of Present Illness:    Peter Garrison is a 58 y.o. male  who presents via audio/video conferencing for a telehealth visit today.  Complex past medical history which include coronary artery disease, remote coronary artery bypass graft, morbid obesity, dyslipidemia, diabetes.  We are waiting still for echocardiogram and stress test.  Those tests has been postponed many times because of  coronavirus situation.  Hopefully finally will be able to get this test.  Still complaining of having easy fatigue tiredness and shortness of breath with exertion as well as some chest pain.  He recently was find out to have very elevated sugar he was put on appropriate medication which include Jardiance and sugars much better.   The patient does not have symptoms concerning for COVID-19 infection (fever, chills, cough, or new SHORTNESS OF BREATH).    Prior CV studies:   The following studies were reviewed today:       No past medical history on file.     Current Meds  Medication Sig  . busPIRone (BUSPAR) 15 MG tablet Take 15 mg by mouth 2 (two) times daily.  . famotidine (PEPCID) 20 MG tablet Take 20 mg by mouth 2 (two) times daily.  . fenofibrate (TRICOR) 48 MG tablet Take 48 mg by mouth daily.  Marland Kitchen glipiZIDE (GLUCOTROL XL) 5 MG 24 hr tablet Take 5 mg by mouth daily.  Marland Kitchen HYDROcodone-acetaminophen (NORCO) 10-325 MG tablet Take 1 tablet by mouth 4 (four) times daily as needed for pain.  Marland Kitchen JARDIANCE 25 MG TABS tablet Take 25 mg by mouth daily.  Javier Docker Oil 1000 MG CAPS Take 1,000 mg by mouth 2 (two) times daily.  Marland Kitchen lisinopril (PRINIVIL,ZESTRIL) 5 MG tablet Take 5 mg by mouth daily.  Marland Kitchen loratadine (CLARITIN) 10 MG tablet Take 10 mg by mouth daily.  . metoprolol tartrate (LOPRESSOR) 25 MG tablet Take 25 mg by mouth 2 (two) times daily.  . nitroGLYCERIN (NITROSTAT) 0.4 MG SL tablet Place 0.4 mg  under the tongue as needed for chest pain.  . rosuvastatin (CRESTOR) 20 MG tablet Take 1 tablet by mouth daily.  . ticagrelor (BRILINTA) 90 MG TABS tablet Take 90 mg by mouth 2 (two) times daily.      Family History: The patient's family history includes Aneurysm in his mother; Cancer in his father; Diabetes in his mother.   ROS:   Please see the history of present illness.     All other systems reviewed and are negative.   Labs/Other Tests and Data Reviewed:     Recent Labs: No  results found for requested labs within last 8760 hours.  Recent Lipid Panel No results found for: CHOL, TRIG, HDL, CHOLHDL, VLDL, LDLCALC, LDLDIRECT    Exam:    Vital Signs:  Wt (!) 304 lb (137.9 kg)   BMI 47.61 kg/m     Wt Readings from Last 3 Encounters:  05/08/19 (!) 304 lb (137.9 kg)  12/17/18 288 lb 9.6 oz (130.9 kg)     Well nourished, well developed in no acute distress. Alert oriented x3 talking to me over the video link.  Not in any distress he is at home and I am in our office in [facility].  Diagnosis for this visit:   1. S/P CABG (coronary artery bypass graft)   2. Mixed hyperlipidemia   3. Benign essential hypertension   4. Obstructive sleep apnea      ASSESSMENT & PLAN:    1.  Status post coronary artery bypass graft.  Does have some atypical pain.  We will schedule him to have a stress test. 2.  Mixed dyslipidemia call his primary care physician to get fasting lipid profile apparently he was told everything is good. 3.  Benign essential hypertension stable continue present management. 4.  Obstructive sleep apnea followed by internal medicine team  COVID-19 Education: The signs and symptoms of COVID-19 were discussed with the patient and how to seek care for testing (follow up with PCP or arrange E-visit).  The importance of social distancing was discussed today.  Patient Risk:   After full review of this patients clinical status, I feel that they are at least moderate risk at this time.  Time:   Today, I have spent 15 minutes with the patient with telehealth technology discussing pt health issues.  I spent 5 minutes reviewing her chart before the visit.  Visit was finished at 12:09 PM.    Medication Adjustments/Labs and Tests Ordered: Current medicines are reviewed at length with the patient today.  Concerns regarding medicines are outlined above.  No orders of the defined types were placed in this encounter.  Medication changes: No orders of the  defined types were placed in this encounter.    Disposition: Follow-up 3 months  Signed, Park Liter, MD, [facility] 05/08/2019 12:08 PM    [place]

## 2019-05-15 ENCOUNTER — Other Ambulatory Visit: Payer: Self-pay

## 2019-05-15 ENCOUNTER — Ambulatory Visit (INDEPENDENT_AMBULATORY_CARE_PROVIDER_SITE_OTHER): Payer: Medicare Other

## 2019-05-15 DIAGNOSIS — E782 Mixed hyperlipidemia: Secondary | ICD-10-CM | POA: Diagnosis not present

## 2019-05-15 DIAGNOSIS — I10 Essential (primary) hypertension: Secondary | ICD-10-CM

## 2019-05-15 DIAGNOSIS — I2510 Atherosclerotic heart disease of native coronary artery without angina pectoris: Secondary | ICD-10-CM

## 2019-05-15 NOTE — Progress Notes (Addendum)
Complete echocardiogram has been performed. Patient opted to not use definity although it was needed.  Jimmy Myla Mauriello RDCS, RVT

## 2019-06-13 ENCOUNTER — Telehealth (HOSPITAL_COMMUNITY): Payer: Self-pay | Admitting: *Deleted

## 2019-06-13 NOTE — Telephone Encounter (Signed)
Patient given detailed instructions per Myocardial Perfusion Study Information Sheet for the test on 06/18/19. Patient notified to arrive 15 minutes early and that it is imperative to arrive on time for appointment to keep from having the test rescheduled.  If you need to cancel or reschedule your appointment, please call the office within 24 hours of your appointment. . Patient verbalized understanding. .Peter Garrison Jacqueline     

## 2019-07-31 ENCOUNTER — Ambulatory Visit: Payer: Medicare Other | Admitting: Cardiology

## 2021-03-09 DIAGNOSIS — Z0000 Encounter for general adult medical examination without abnormal findings: Secondary | ICD-10-CM | POA: Insufficient documentation

## 2021-04-22 ENCOUNTER — Encounter (HOSPITAL_COMMUNITY): Payer: Self-pay | Admitting: *Deleted

## 2021-04-22 ENCOUNTER — Telehealth: Payer: Self-pay

## 2021-04-22 ENCOUNTER — Other Ambulatory Visit: Payer: Self-pay

## 2021-04-22 ENCOUNTER — Inpatient Hospital Stay (HOSPITAL_COMMUNITY)
Admission: EM | Admit: 2021-04-22 | Discharge: 2021-04-24 | DRG: 287 | Disposition: A | Discharge status: Home / Self Care | Payer: Medicare Other | Attending: Cardiology | Admitting: Cardiology

## 2021-04-22 ENCOUNTER — Telehealth: Payer: Self-pay | Admitting: Cardiology

## 2021-04-22 ENCOUNTER — Emergency Department (HOSPITAL_COMMUNITY): Payer: Medicare Other

## 2021-04-22 ENCOUNTER — Inpatient Hospital Stay (HOSPITAL_COMMUNITY)
Admission: EM | Disposition: A | Discharge status: Home / Self Care | Payer: Self-pay | Source: Home / Self Care | Attending: Cardiology

## 2021-04-22 DIAGNOSIS — Z951 Presence of aortocoronary bypass graft: Secondary | ICD-10-CM | POA: Diagnosis not present

## 2021-04-22 DIAGNOSIS — Z87891 Personal history of nicotine dependence: Secondary | ICD-10-CM

## 2021-04-22 DIAGNOSIS — I25700 Atherosclerosis of coronary artery bypass graft(s), unspecified, with unstable angina pectoris: Secondary | ICD-10-CM | POA: Diagnosis not present

## 2021-04-22 DIAGNOSIS — Z8249 Family history of ischemic heart disease and other diseases of the circulatory system: Secondary | ICD-10-CM | POA: Diagnosis not present

## 2021-04-22 DIAGNOSIS — Z885 Allergy status to narcotic agent status: Secondary | ICD-10-CM

## 2021-04-22 DIAGNOSIS — I200 Unstable angina: Secondary | ICD-10-CM | POA: Diagnosis not present

## 2021-04-22 DIAGNOSIS — R079 Chest pain, unspecified: Secondary | ICD-10-CM | POA: Diagnosis not present

## 2021-04-22 DIAGNOSIS — I25110 Atherosclerotic heart disease of native coronary artery with unstable angina pectoris: Secondary | ICD-10-CM | POA: Diagnosis present

## 2021-04-22 DIAGNOSIS — I493 Ventricular premature depolarization: Secondary | ICD-10-CM | POA: Diagnosis present

## 2021-04-22 DIAGNOSIS — I10 Essential (primary) hypertension: Secondary | ICD-10-CM | POA: Diagnosis present

## 2021-04-22 DIAGNOSIS — Z833 Family history of diabetes mellitus: Secondary | ICD-10-CM

## 2021-04-22 DIAGNOSIS — Z888 Allergy status to other drugs, medicaments and biological substances status: Secondary | ICD-10-CM

## 2021-04-22 DIAGNOSIS — I25710 Atherosclerosis of autologous vein coronary artery bypass graft(s) with unstable angina pectoris: Principal | ICD-10-CM | POA: Diagnosis present

## 2021-04-22 DIAGNOSIS — E1165 Type 2 diabetes mellitus with hyperglycemia: Secondary | ICD-10-CM | POA: Diagnosis not present

## 2021-04-22 DIAGNOSIS — Z20822 Contact with and (suspected) exposure to covid-19: Secondary | ICD-10-CM | POA: Diagnosis present

## 2021-04-22 DIAGNOSIS — E6609 Other obesity due to excess calories: Secondary | ICD-10-CM | POA: Diagnosis present

## 2021-04-22 DIAGNOSIS — Z809 Family history of malignant neoplasm, unspecified: Secondary | ICD-10-CM | POA: Diagnosis not present

## 2021-04-22 DIAGNOSIS — E785 Hyperlipidemia, unspecified: Secondary | ICD-10-CM | POA: Diagnosis present

## 2021-04-22 DIAGNOSIS — Z859 Personal history of malignant neoplasm, unspecified: Secondary | ICD-10-CM

## 2021-04-22 DIAGNOSIS — I214 Non-ST elevation (NSTEMI) myocardial infarction: Secondary | ICD-10-CM | POA: Diagnosis present

## 2021-04-22 DIAGNOSIS — G4733 Obstructive sleep apnea (adult) (pediatric): Secondary | ICD-10-CM | POA: Diagnosis present

## 2021-04-22 DIAGNOSIS — Z6841 Body Mass Index (BMI) 40.0 and over, adult: Secondary | ICD-10-CM | POA: Diagnosis not present

## 2021-04-22 DIAGNOSIS — I209 Angina pectoris, unspecified: Secondary | ICD-10-CM | POA: Diagnosis present

## 2021-04-22 DIAGNOSIS — K219 Gastro-esophageal reflux disease without esophagitis: Secondary | ICD-10-CM | POA: Diagnosis present

## 2021-04-22 DIAGNOSIS — I252 Old myocardial infarction: Secondary | ICD-10-CM

## 2021-04-22 HISTORY — DX: Essential (primary) hypertension: I10

## 2021-04-22 HISTORY — DX: Type 2 diabetes mellitus without complications: E11.9

## 2021-04-22 HISTORY — PX: LEFT HEART CATH AND CORS/GRAFTS ANGIOGRAPHY: CATH118250

## 2021-04-22 HISTORY — DX: Acute myocardial infarction, unspecified: I21.9

## 2021-04-22 HISTORY — DX: Malignant (primary) neoplasm, unspecified: C80.1

## 2021-04-22 LAB — BASIC METABOLIC PANEL
Anion gap: 10 (ref 5–15)
Anion gap: 9 (ref 5–15)
BUN: 16 mg/dL (ref 6–20)
BUN: 14 mg/dL (ref 6–20)
CO2: 22 mmol/L (ref 22–32)
CO2: 23 mmol/L (ref 22–32)
Calcium: 9.9 mg/dL (ref 8.9–10.3)
Calcium: 9.6 mg/dL (ref 8.9–10.3)
Chloride: 104 mmol/L (ref 98–111)
Chloride: 105 mmol/L (ref 98–111)
Creatinine, Ser: 1.11 mg/dL (ref 0.61–1.24)
Creatinine, Ser: 1.06 mg/dL (ref 0.61–1.24)
GFR, Estimated: >60 mL/min (ref >60)
GFR, Estimated: >60 mL/min (ref >60)
Glucose, Bld: 200 mg/dL — ABNORMAL HIGH (ref 70–99)
Glucose, Bld: 180 mg/dL — ABNORMAL HIGH (ref 70–99)
Potassium: 5.4 mmol/L — ABNORMAL HIGH (ref 3.5–5.1)
Potassium: 4.4 mmol/L (ref 3.5–5.1)
Sodium: 136 mmol/L (ref 135–145)
Sodium: 137 mmol/L (ref 135–145)

## 2021-04-22 LAB — CBC
HCT: 52.2 % — ABNORMAL HIGH (ref 39.0–52.0)
Hemoglobin: 17.2 g/dL — ABNORMAL HIGH (ref 13.0–17.0)
MCH: 29.1 pg (ref 26.0–34.0)
MCHC: 33 g/dL (ref 30.0–36.0)
MCV: 88.3 fL (ref 80.0–100.0)
Platelets: 273 10*3/uL (ref 150–400)
RBC: 5.91 MIL/uL — ABNORMAL HIGH (ref 4.22–5.81)
RDW: 13.2 % (ref 11.5–15.5)
WBC: 11 10*3/uL — ABNORMAL HIGH (ref 4.0–10.5)
nRBC: 0 % (ref 0.0–0.2)

## 2021-04-22 LAB — RESP PANEL BY RT-PCR (FLU A&B, COVID) ARPGX2
Influenza A by PCR: NEGATIVE
Influenza B by PCR: NEGATIVE
SARS Coronavirus 2 by RT PCR: NEGATIVE

## 2021-04-22 LAB — HEPATIC FUNCTION PANEL
ALT: 25 U/L (ref 0–44)
AST: 31 U/L (ref 15–41)
Albumin: 3.8 g/dL (ref 3.5–5.0)
Alkaline Phosphatase: 62 U/L (ref 38–126)
Bilirubin, Direct: <0.1 mg/dL (ref 0.0–0.2)
Total Bilirubin: 0.8 mg/dL (ref 0.3–1.2)
Total Protein: 7.1 g/dL (ref 6.5–8.1)

## 2021-04-22 LAB — TSH: TSH: 2.795 u[IU]/mL (ref 0.350–4.500)

## 2021-04-22 LAB — PROTIME-INR
INR: 1.1 (ref 0.8–1.2)
Prothrombin Time: 14.4 seconds (ref 11.4–15.2)

## 2021-04-22 LAB — HEMOGLOBIN A1C
Hgb A1c MFr Bld: 7.2 % — ABNORMAL HIGH (ref 4.8–5.6)
Mean Plasma Glucose: 159.94 mg/dL

## 2021-04-22 LAB — HIV ANTIBODY (ROUTINE TESTING W REFLEX): HIV Screen 4th Generation wRfx: NONREACTIVE

## 2021-04-22 LAB — TROPONIN I (HIGH SENSITIVITY)
Troponin I (High Sensitivity): 9 ng/L (ref <18)
Troponin I (High Sensitivity): 24 ng/L — ABNORMAL HIGH (ref <18)
Troponin I (High Sensitivity): 8086 ng/L (ref <18)

## 2021-04-22 LAB — GLUCOSE, CAPILLARY
Glucose-Capillary: 132 mg/dL — ABNORMAL HIGH (ref 70–99)
Glucose-Capillary: 281 mg/dL — ABNORMAL HIGH (ref 70–99)

## 2021-04-22 LAB — APTT: aPTT: 106 seconds — ABNORMAL HIGH (ref 24–36)

## 2021-04-22 LAB — MAGNESIUM: Magnesium: 1.9 mg/dL (ref 1.7–2.4)

## 2021-04-22 SURGERY — LEFT HEART CATH AND CORS/GRAFTS ANGIOGRAPHY
Anesthesia: LOCAL

## 2021-04-22 MED ORDER — LORATADINE 10 MG PO TABS
10.0000 mg | ORAL_TABLET | Freq: Every evening | ORAL | Status: DC
  Administered 2021-04-22 – 2021-04-23 (×2): 10 mg via ORAL
  Filled 2021-04-22 (×2): qty 1

## 2021-04-22 MED ORDER — NITROGLYCERIN 0.4 MG SL SUBL: 0.4000 mg | SUBLINGUAL_TABLET | SUBLINGUAL | Status: DC | PRN

## 2021-04-22 MED ORDER — MIDAZOLAM HCL 2 MG/2ML IJ SOLN
INTRAMUSCULAR | Status: AC
  Filled 2021-04-22: qty 2

## 2021-04-22 MED ORDER — FENTANYL CITRATE (PF) 100 MCG/2ML IJ SOLN
INTRAMUSCULAR | Status: DC | PRN
  Administered 2021-04-22 (×2): 25 ug via INTRAVENOUS

## 2021-04-22 MED ORDER — TICAGRELOR 90 MG PO TABS
90.0000 mg | ORAL_TABLET | Freq: Two times a day (BID) | ORAL | Status: DC
  Administered 2021-04-22 – 2021-04-24 (×4): 90 mg via ORAL
  Filled 2021-04-22 (×4): qty 1

## 2021-04-22 MED ORDER — SODIUM CHLORIDE 0.9% FLUSH: 3.0000 mL | INTRAVENOUS | Status: DC | PRN

## 2021-04-22 MED ORDER — ASPIRIN 81 MG PO CHEW: 324.0000 mg | CHEWABLE_TABLET | ORAL | Status: DC

## 2021-04-22 MED ORDER — HEPARIN (PORCINE) 25000 UT/250ML-% IV SOLN
1200.0000 [IU]/h | INTRAVENOUS | Status: DC
  Administered 2021-04-22: 1200 [IU]/h via INTRAVENOUS
  Filled 2021-04-22: qty 250

## 2021-04-22 MED ORDER — SODIUM CHLORIDE 0.9 % IV SOLN: 250.0000 mL | INTRAVENOUS | Status: DC | PRN

## 2021-04-22 MED ORDER — ROSUVASTATIN CALCIUM 20 MG PO TABS
20.0000 mg | ORAL_TABLET | Freq: Every evening | ORAL | Status: DC
  Administered 2021-04-22 – 2021-04-23 (×2): 20 mg via ORAL
  Filled 2021-04-22 (×2): qty 1

## 2021-04-22 MED ORDER — HEPARIN BOLUS VIA INFUSION
4000.0000 [IU] | Freq: Once | INTRAVENOUS | Status: AC
  Administered 2021-04-22: 4000 [IU] via INTRAVENOUS
  Filled 2021-04-22: qty 4000

## 2021-04-22 MED ORDER — LIDOCAINE HCL (PF) 1 % IJ SOLN
INTRAMUSCULAR | Status: AC
  Filled 2021-04-22: qty 30

## 2021-04-22 MED ORDER — ASPIRIN 81 MG PO CHEW
324.0000 mg | CHEWABLE_TABLET | Freq: Once | ORAL | Status: AC
  Administered 2021-04-22: 324 mg via ORAL
  Filled 2021-04-22: qty 4

## 2021-04-22 MED ORDER — ZOLPIDEM TARTRATE 5 MG PO TABS: 5.0000 mg | ORAL_TABLET | Freq: Every evening | ORAL | Status: DC | PRN

## 2021-04-22 MED ORDER — HYDRALAZINE HCL 20 MG/ML IJ SOLN: 10.0000 mg | INTRAMUSCULAR | Status: AC | PRN

## 2021-04-22 MED ORDER — ASPIRIN 300 MG RE SUPP: 300.0000 mg | RECTAL | Status: DC

## 2021-04-22 MED ORDER — HEPARIN SODIUM (PORCINE) 1000 UNIT/ML IJ SOLN
INTRAMUSCULAR | Status: AC
  Filled 2021-04-22: qty 1

## 2021-04-22 MED ORDER — SODIUM CHLORIDE 0.9% FLUSH
3.0000 mL | Freq: Two times a day (BID) | INTRAVENOUS | Status: DC
  Administered 2021-04-23 – 2021-04-24 (×2): 3 mL via INTRAVENOUS

## 2021-04-22 MED ORDER — METOPROLOL TARTRATE 25 MG PO TABS
25.0000 mg | ORAL_TABLET | Freq: Two times a day (BID) | ORAL | Status: DC
  Administered 2021-04-22 – 2021-04-23 (×2): 25 mg via ORAL
  Filled 2021-04-22 (×2): qty 1

## 2021-04-22 MED ORDER — ASPIRIN EC 81 MG PO TBEC
81.0000 mg | DELAYED_RELEASE_TABLET | Freq: Every evening | ORAL | Status: DC
  Administered 2021-04-22 – 2021-04-23 (×2): 81 mg via ORAL
  Filled 2021-04-22 (×2): qty 1

## 2021-04-22 MED ORDER — AMOXICILLIN 500 MG PO CAPS
500.0000 mg | ORAL_CAPSULE | Freq: Three times a day (TID) | ORAL | Status: DC
  Administered 2021-04-22 – 2021-04-24 (×6): 500 mg via ORAL
  Filled 2021-04-22 (×7): qty 1

## 2021-04-22 MED ORDER — SODIUM CHLORIDE 0.9% FLUSH
3.0000 mL | Freq: Two times a day (BID) | INTRAVENOUS | Status: DC
  Administered 2021-04-22 – 2021-04-23 (×3): 3 mL via INTRAVENOUS

## 2021-04-22 MED ORDER — PANTOPRAZOLE SODIUM 40 MG PO TBEC
40.0000 mg | DELAYED_RELEASE_TABLET | Freq: Every evening | ORAL | Status: DC
  Administered 2021-04-22 – 2021-04-23 (×2): 40 mg via ORAL
  Filled 2021-04-22 (×2): qty 1

## 2021-04-22 MED ORDER — ONDANSETRON HCL 4 MG/2ML IJ SOLN
4.0000 mg | Freq: Once | INTRAMUSCULAR | Status: AC
  Administered 2021-04-22: 4 mg via INTRAVENOUS
  Filled 2021-04-22: qty 2

## 2021-04-22 MED ORDER — SODIUM CHLORIDE 0.9 % IV SOLN: INTRAVENOUS | Status: AC

## 2021-04-22 MED ORDER — BUSPIRONE HCL 5 MG PO TABS
15.0000 mg | ORAL_TABLET | Freq: Two times a day (BID) | ORAL | Status: DC
  Administered 2021-04-22 – 2021-04-24 (×4): 15 mg via ORAL
  Filled 2021-04-22 (×4): qty 3

## 2021-04-22 MED ORDER — ONDANSETRON HCL 4 MG/2ML IJ SOLN: 4.0000 mg | Freq: Four times a day (QID) | INTRAMUSCULAR | Status: DC | PRN

## 2021-04-22 MED ORDER — IOHEXOL 350 MG/ML SOLN
INTRAVENOUS | Status: DC | PRN
  Administered 2021-04-22: 80 mL via INTRA_ARTERIAL

## 2021-04-22 MED ORDER — VERAPAMIL HCL 2.5 MG/ML IV SOLN
INTRAVENOUS | Status: AC
  Filled 2021-04-22: qty 2

## 2021-04-22 MED ORDER — HEPARIN (PORCINE) IN NACL 1000-0.9 UT/500ML-% IV SOLN
INTRAVENOUS | Status: AC
  Filled 2021-04-22: qty 1000

## 2021-04-22 MED ORDER — HYDROMORPHONE HCL 1 MG/ML IJ SOLN
1.0000 mg | Freq: Once | INTRAMUSCULAR | Status: AC
Start: 2021-04-22 — End: 2021-04-22
  Administered 2021-04-22: 1 mg via INTRAVENOUS
  Filled 2021-04-22: qty 1

## 2021-04-22 MED ORDER — HEPARIN (PORCINE) IN NACL 1000-0.9 UT/500ML-% IV SOLN
INTRAVENOUS | Status: DC | PRN
  Administered 2021-04-22 (×4): 500 mL

## 2021-04-22 MED ORDER — ACETAMINOPHEN 325 MG PO TABS
650.0000 mg | ORAL_TABLET | ORAL | Status: DC | PRN
  Administered 2021-04-22 – 2021-04-23 (×3): 650 mg via ORAL
  Filled 2021-04-22 (×3): qty 2

## 2021-04-22 MED ORDER — MORPHINE SULFATE (PF) 4 MG/ML IV SOLN
4.0000 mg | Freq: Once | INTRAVENOUS | Status: AC
  Administered 2021-04-22: 4 mg via INTRAVENOUS
  Filled 2021-04-22: qty 1

## 2021-04-22 MED ORDER — MIDAZOLAM HCL 2 MG/2ML IJ SOLN
INTRAMUSCULAR | Status: DC | PRN
  Administered 2021-04-22 (×2): 1 mg via INTRAVENOUS

## 2021-04-22 MED ORDER — NITROGLYCERIN IN D5W 200-5 MCG/ML-% IV SOLN
0.0000 ug/min | INTRAVENOUS | Status: DC
  Administered 2021-04-22: 5 ug/min via INTRAVENOUS
  Administered 2021-04-22: 95 ug/min via INTRAVENOUS
  Administered 2021-04-23: 100 ug/min via INTRAVENOUS
  Administered 2021-04-23: 90 ug/min via INTRAVENOUS
  Filled 2021-04-22 (×3): qty 250

## 2021-04-22 MED ORDER — INSULIN ASPART 100 UNIT/ML IJ SOLN
0.0000 [IU] | Freq: Every day | INTRAMUSCULAR | Status: DC
  Administered 2021-04-22 – 2021-04-23 (×2): 3 [IU] via SUBCUTANEOUS

## 2021-04-22 MED ORDER — FENTANYL CITRATE (PF) 100 MCG/2ML IJ SOLN
INTRAMUSCULAR | Status: AC
  Filled 2021-04-22: qty 2

## 2021-04-22 MED ORDER — FENOFIBRATE 54 MG PO TABS
54.0000 mg | ORAL_TABLET | Freq: Every day | ORAL | Status: DC
  Administered 2021-04-23 – 2021-04-24 (×2): 54 mg via ORAL
  Filled 2021-04-22 (×2): qty 1

## 2021-04-22 MED ORDER — ASPIRIN EC 81 MG PO TBEC: 81.0000 mg | DELAYED_RELEASE_TABLET | Freq: Every day | ORAL | Status: DC

## 2021-04-22 MED ORDER — LIDOCAINE HCL (PF) 1 % IJ SOLN
INTRAMUSCULAR | Status: DC | PRN
  Administered 2021-04-22: 20 mL via INTRADERMAL
  Administered 2021-04-22: 2 mL via INTRADERMAL

## 2021-04-22 MED ORDER — HEPARIN (PORCINE) 25000 UT/250ML-% IV SOLN
1500.0000 [IU]/h | INTRAVENOUS | Status: DC
  Administered 2021-04-23: 1200 [IU]/h via INTRAVENOUS
  Filled 2021-04-22: qty 250

## 2021-04-22 MED ORDER — SODIUM CHLORIDE 0.9 % IV SOLN: INTRAVENOUS | Status: DC

## 2021-04-22 MED ORDER — INSULIN ASPART 100 UNIT/ML IJ SOLN
0.0000 [IU] | Freq: Three times a day (TID) | INTRAMUSCULAR | Status: DC
  Administered 2021-04-23 – 2021-04-24 (×4): 2 [IU] via SUBCUTANEOUS

## 2021-04-22 MED ORDER — ASPIRIN 81 MG PO CHEW: 81.0000 mg | CHEWABLE_TABLET | ORAL | Status: DC

## 2021-04-22 MED ORDER — HYDROCODONE-ACETAMINOPHEN 10-325 MG PO TABS: 1.0000 | ORAL_TABLET | Freq: Four times a day (QID) | ORAL | Status: DC | PRN

## 2021-04-22 MED ORDER — SODIUM CHLORIDE 0.9 % IV SOLN
INTRAVENOUS | Status: AC | PRN
  Administered 2021-04-22: 10 mL/h via INTRAVENOUS

## 2021-04-22 MED ORDER — LISINOPRIL 5 MG PO TABS
5.0000 mg | ORAL_TABLET | Freq: Every day | ORAL | Status: DC
  Administered 2021-04-23 – 2021-04-24 (×2): 5 mg via ORAL
  Filled 2021-04-22 (×2): qty 1

## 2021-04-22 MED ORDER — LABETALOL HCL 5 MG/ML IV SOLN: 10.0000 mg | INTRAVENOUS | Status: AC | PRN

## 2021-04-22 SURGICAL SUPPLY — 11 items
CATH INFINITI 5FR MPB2 (CATHETERS) ×1 IMPLANT
CLOSURE MYNX CONTROL 5F (Vascular Products) ×1 IMPLANT
GLIDESHEATH SLEND A-KIT 6F 22G (SHEATH) ×1 IMPLANT
KIT HEART LEFT (KITS) ×2 IMPLANT
MAT PREVALON FULL STRYKER (MISCELLANEOUS) ×1 IMPLANT
PACK CARDIAC CATHETERIZATION (CUSTOM PROCEDURE TRAY) ×2 IMPLANT
SHEATH PINNACLE 5F 10CM (SHEATH) ×1 IMPLANT
SHEATH PROBE COVER 6X72 (BAG) ×1 IMPLANT
TRANSDUCER W/STOPCOCK (MISCELLANEOUS) ×2 IMPLANT
TUBING CIL FLEX 10 FLL-RA (TUBING) ×2 IMPLANT
WIRE EMERALD 3MM-J .035X150CM (WIRE) ×1 IMPLANT

## 2021-04-22 NOTE — ED Notes (Signed)
Report given to [facility], [place] [facility]

## 2021-04-22 NOTE — ED Provider Notes (Signed)
[facility] EMERGENCY DEPARTMENT Provider Note   CSN: [id number] Arrival date & time: 04/22/21  1019     History Chief Complaint  Patient presents with   Chest Pain    Peter Garrison is a 60 y.o. male.  Pt presents to the ED today with CP.  He said it is a pressure sensation.  He has a hx of prior MI and CABG.  He is followed by Dr. Agustin Cree, but has not seen him since 2020.  He was supposed to get a stress test in September of 2020, but it does not look like it ever got done.  Pt has taken 3 nitro today.  His CP has improved with the nitro, but is still a 10.  Pt denies any sob.     Past Medical History:  Diagnosis Date   Cancer ([place])    Diabetes mellitus without complication ([place])    Hypertension    MI (myocardial infarction) [facility])     Patient Active Problem List   Diagnosis Date Noted   Type 2 diabetes mellitus with hyperglycemia, without long-term current use of insulin ([place]) 11/27/2016   Unstable angina ([place]) 11/27/2016   Obesity due to excess calories 01/28/2016   Obstructive sleep apnea 01/28/2016   Benign essential hypertension 04/30/2015   CAD (coronary artery disease) 03/02/2015   Hyperlipidemia 03/02/2015   MI, acute, non ST segment elevation ([place]) 03/02/2015   S/P CABG (coronary artery bypass graft) 03/02/2015    Past Surgical History:  Procedure Laterality Date   APPENDECTOMY     CORONARY ARTERY BYPASS GRAFT     KIDNEY SURGERY     KNEE SURGERY         Family History  Problem Relation Age of Onset   Aneurysm Mother    Diabetes Mother    Cancer Father    Heart disease Brother    Heart attack Brother    Heart attack Paternal Uncle     Social History   Tobacco Use   Smoking status: Former   Smokeless tobacco: Current    Types: Snuff  Vaping Use   Vaping Use: Never used  Substance Use Topics   Alcohol use: Not Currently   Drug use: Never    Home Medications Prior to Admission medications   Medication Sig Start Date  End Date Taking? Authorizing Provider  amoxicillin-clavulanate (AUGMENTIN) 875-125 MG tablet Take 1 tablet by mouth 2 (two) times daily. 04/20/21  Yes [provider]  Ascorbic Acid (VITAMIN C PO) Take 1 tablet by mouth daily.   Yes [provider]  aspirin EC 81 MG tablet Take 81 mg by mouth every evening. Swallow whole.   Yes [provider]  busPIRone (BUSPAR) 15 MG tablet Take 15 mg by mouth 2 (two) times daily. 02/27/15  Yes [provider]  Cholecalciferol 50 MCG (2000 UT) TABS Take 1 tablet by mouth daily.   Yes [provider]  famotidine (PEPCID) 20 MG tablet Take 20 mg by mouth every evening.   Yes [provider]  fenofibrate (TRICOR) 48 MG tablet Take 48 mg by mouth daily. 11/14/16  Yes [provider]  glipiZIDE (GLUCOTROL) 10 MG tablet Take 10 mg by mouth daily. 04/06/21  Yes [provider]  HYDROcodone-acetaminophen (NORCO) 10-325 MG tablet Take 1 tablet by mouth 4 (four) times daily as needed for pain. 03/01/15  Yes [provider]  Astrid Drafts 1000 MG CAPS Take 1,000 mg by mouth every evening. 03/01/15  Yes [provider]  lisinopril (PRINIVIL,ZESTRIL) 5 MG tablet Take 5 mg by mouth daily. 02/27/15  Yes [provider]  loratadine (CLARITIN) 10 MG tablet Take 10 mg by mouth every evening.   Yes [provider]  metoprolol tartrate (LOPRESSOR) 25 MG tablet Take 25 mg by mouth 2 (two) times daily. 05/04/15  Yes [provider]  nitroGLYCERIN (NITROSTAT) 0.4 MG SL tablet Place 0.4 mg under the tongue as needed for chest pain. 11/17/16 04/22/21 Yes [provider]  OZEMPIC, 1 MG/DOSE, 4 MG/3ML SOPN Inject 1 mg into the skin once a week. Mondays 03/04/21  Yes [provider]  pantoprazole (PROTONIX) 40 MG tablet Take 40 mg by mouth every evening. 12/20/20  Yes [provider]  rosuvastatin (CRESTOR) 20 MG tablet Take 20 mg by mouth every evening. 10/14/16  Yes  [provider]  ticagrelor (BRILINTA) 90 MG TABS tablet Take 90 mg by mouth 2 (two) times daily. 04/10/17  Yes [provider]  zinc gluconate 50 MG tablet Take 1 tablet by mouth daily.   Yes [provider]    Allergies    Adhesive [tape], Percocet [oxycodone-acetaminophen], and Prednisone  Review of Systems   Review of Systems  Cardiovascular:  Positive for chest pain.  All other systems reviewed and are negative.  Physical Exam Updated Vital Signs BP (!) 143/75   Pulse (!) 58   Temp 98.4 F (36.9 C) (Oral)   Resp 12   Ht 5\' 7"  (1.702 m)   Wt 119.3 kg   SpO2 96%   BMI 41.19 kg/m   Physical Exam Vitals and nursing note reviewed.  Constitutional:      Appearance: He is well-developed. He is obese.  HENT:     Head: Normocephalic and atraumatic.  Eyes:     Extraocular Movements: Extraocular movements intact.     Pupils: Pupils are equal, round, and reactive to light.  Cardiovascular:     Rate and Rhythm: Normal rate and regular rhythm.     Heart sounds: Normal heart sounds.  Pulmonary:     Effort: Pulmonary effort is normal.     Breath sounds: Normal breath sounds.  Abdominal:     General: Bowel sounds are normal.     Palpations: Abdomen is soft.  Musculoskeletal:        General: Normal range of motion.     Cervical back: Normal range of motion and neck supple.  Skin:    General: Skin is warm.     Capillary Refill: Capillary refill takes less than 2 seconds.  Neurological:     General: No focal deficit present.     Mental Status: He is alert and oriented to person, place, and time.  Psychiatric:        Mood and Affect: Mood normal.        Behavior: Behavior normal.    ED Results / Procedures / Treatments   Labs (all labs ordered are listed, but only abnormal results are displayed) Labs Reviewed  BASIC METABOLIC PANEL - Abnormal; Notable for the following components:      Result Value   Potassium 5.4 (*)    Glucose, Bld 200 (*)     All other components within normal limits  CBC - Abnormal; Notable for the following components:   WBC 11.0 (*)    RBC 5.91 (*)    Hemoglobin 17.2 (*)    HCT 52.2 (*)    All other components within normal limits  TROPONIN I (HIGH  SENSITIVITY) - Abnormal; Notable for the following components:   Troponin I (High Sensitivity) 24 (*)    All other components within normal limits  RESP PANEL BY RT-PCR (FLU A&B, COVID) ARPGX2  HEPARIN LEVEL (UNFRACTIONATED)  APTT  PROTIME-INR  BASIC METABOLIC PANEL  MAGNESIUM  HEPATIC FUNCTION PANEL  TSH  TROPONIN I (HIGH SENSITIVITY)    EKG EKG Interpretation  Date/Time:  Thursday April 22 2021 10:38:01 EDT Ventricular Rate:  96 PR Interval:  168 QRS Duration: 104 QT Interval:  395 QTC Calculation: 350 R Axis:   49 Text Interpretation: Sinus rhythm Paired ventricular premature complexes Borderline low voltage, extremity leads Minimal ST elevation, inferior leads new from earlier today Confirmed by Isla Pence ([id number]) on 04/22/2021 10:54:05 AM  Radiology DG Chest Port 1 View  Result Date: 04/22/2021 CLINICAL DATA:  60 year old male with chest pain for 2-3 days. Nitroglycerin without relief. EXAM: PORTABLE CHEST 1 VIEW COMPARISON:  Portable chest 03/01/2015 and earlier. FINDINGS: Portable AP upright view at 1055 hours. Prior CABG. Lung volumes and mediastinal contours are stable and within normal limits. Visualized tracheal air column is within normal limits. Allowing for portable technique the lungs are clear. No pneumothorax or pleural effusion. No acute osseous abnormality identified. IMPRESSION: No acute cardiopulmonary abnormality. Electronically Signed   By: Genevie Ann M.D.   On: 04/22/2021 11:21    Procedures Procedures   Medications Ordered in ED Medications  nitroGLYCERIN 50 mg in dextrose 5 % 250 mL (0.2 mg/mL) infusion (80 mcg/min Intravenous Rate/Dose Change 04/22/21 1400)  heparin ADULT infusion 100 units/mL (25000 units/222mL)  (1,200 Units/hr Intravenous New Bag/Given 04/22/21 1219)  aspirin chewable tablet 324 mg (324 mg Oral Given 04/22/21 1056)  morphine 4 MG/ML injection 4 mg (4 mg Intravenous Given 04/22/21 1056)  ondansetron (ZOFRAN) injection 4 mg (4 mg Intravenous Given 04/22/21 1053)  heparin bolus via infusion 4,000 Units (4,000 Units Intravenous Bolus from Bag 04/22/21 1220)  HYDROmorphone (DILAUDID) injection 1 mg (1 mg Intravenous Given 04/22/21 1305)    ED Course  I have reviewed the triage vital signs and the nursing notes.  Pertinent labs & imaging results that were available during my care of the patient were reviewed by me and considered in my medical decision making (see chart for details).    MDM Rules/Calculators/A&P HEAR Score: 6                        Pt is still having severe cp after 4 mg of morphine.  Nitro and heparin drip ordered.  Pt d/w cards who will admit.  CRITICAL CARE Performed by: Isla Pence   Total critical care time: 30 minutes  Critical care time was exclusive of separately billable procedures and treating other patients.  Critical care was necessary to treat or prevent imminent or life-threatening deterioration.  Critical care was time spent personally by me on the following activities: development of treatment plan with patient and/or surrogate as well as nursing, discussions with consultants, evaluation of patient's response to treatment, examination of patient, obtaining history from patient or surrogate, ordering and performing treatments and interventions, ordering and review of laboratory studies, ordering and review of radiographic studies, pulse oximetry and re-evaluation of patient's condition.   Final Clinical Impression(s) / ED Diagnoses Final diagnoses:  Unstable angina [facility])    Rx / DC Orders ED Discharge Orders     None        Isla Pence, MD 04/22/21 1425

## 2021-04-22 NOTE — Telephone Encounter (Signed)
Called patient's wife to let her know I can not force the Emergency Department to do anything but she can give them all the information they need about his symptoms and his past medical issues and they will do the best jobs to get him feelings better and give him the care he needs. She thanked me for calling her.

## 2021-04-22 NOTE — Progress Notes (Signed)
ANTICOAGULATION CONSULT NOTE - Initial Consult  Pharmacy Consult for heparin Indication: chest pain/ACS  Allergies  Allergen Reactions   Adhesive [Tape]    Percocet [Oxycodone-Acetaminophen]    Prednisone Other (See Comments)    Vision changes    Patient Measurements: Height: 5\' 7"  (170.2 cm) Weight: 119.3 kg (263 lb) IBW/kg (Calculated) : 66.1 Heparin Dosing Weight: 93.6 kg  Vital Signs: Temp: 98.4 F (36.9 C) (07/14 1024) Temp Source: Oral (07/14 1024) BP: 193/89 (07/14 1145) Pulse Rate: 57 (07/14 1145)  Labs: Recent Labs    04/22/21 1040  HGB 17.2*  HCT 52.2*  PLT 273  CREATININE 1.11  TROPONINIHS 9    Estimated Creatinine Clearance: 88.6 mL/min (by C-G formula based on SCr of 1.11 mg/dL).   Medical History: Past Medical History:  Diagnosis Date   Cancer ([place])    Diabetes mellitus without complication ([place])    Hypertension    MI (myocardial infarction) [facility])    Assessment: 60 yo M with CP (hx of prior MI and CABG), took 3 SL NTG and CP improved but still hurting, denies SOB.  No AC PTA.  Goal of Therapy:  Heparin level 0.3-0.7 units/ml Monitor platelets by anticoagulation protocol: Yes   Plan:  Give 4000 units bolus x 1 Start heparin infusion at 1200 units/hr -f/u 6h HL today -Monitor daily HL, CBC, and any s/sx of bleeding  Joetta Manners, PharmD, [facility] Emergency Medicine Clinical Pharmacist ED RPh Phone: [facility]: [phone]

## 2021-04-22 NOTE — Progress Notes (Signed)
ANTICOAGULATION CONSULT NOTE - Initial Consult  Pharmacy Consult for heparin Indication: chest pain/ACS  Allergies  Allergen Reactions   Adhesive [Tape]    Percocet [Oxycodone-Acetaminophen]    Prednisone Other (See Comments)    Vision changes    Patient Measurements: Height: 5\' 7"  (170.2 cm) Weight: 119.3 kg (263 lb) IBW/kg (Calculated) : 66.1 Heparin Dosing Weight: 93.6 kg  Vital Signs: Temp: 98.2 F (36.8 C) (07/14 1626) Temp Source: Oral (07/14 1626) BP: 114/73 (07/14 1626) Pulse Rate: 76 (07/14 1626)  Labs: Recent Labs    04/22/21 1040 04/22/21 1223 04/22/21 1259 04/22/21 1332  HGB 17.2*  --   --   --   HCT 52.2*  --   --   --   PLT 273  --   --   --   APTT  --   --  106*  --   LABPROT  --   --  14.4  --   INR  --   --  1.1  --   CREATININE 1.11  --   --  1.06  TROPONINIHS 9 24*  --   --      Estimated Creatinine Clearance: 92.8 mL/min (by C-G formula based on SCr of 1.06 mg/dL).   Medical History: Past Medical History:  Diagnosis Date   Cancer ([place])    Diabetes mellitus without complication ([place])    Hypertension    MI (myocardial infarction) [facility])    Assessment: 60 yo M with CP (hx of prior MI and CABG), took 3 SL NTG and CP improved but still hurting, denies SOB. No AC PTA. Post procedure with sheath removal at 1611 on 7/14.   Goal of Therapy:  Heparin level 0.3-0.7 units/ml Monitor platelets by anticoagulation protocol: Yes   Plan:  Start heparin infusion at 1200 units/hr at 0000 -f/u 6h HL today at 0600 -Monitor daily HL, CBC, and any s/sx of bleeding  Varney Daily, PharmD PGY1 Pharmacy Resident  Please check AMION for all [facility] pharmacy phone numbers After 10:00 PM call main pharmacy [phone]

## 2021-04-22 NOTE — CV Procedure (Signed)
Total occlusion of SVG to obtuse marginals/circumflex.  Dye staining in the proximal vessel above the stent suggesting the culprit for the patient's chest discomfort that started 8 AM.  Currently minimal pain. Total occlusion of SVG to LAD (chronic and previously documented) Total occlusion of SVG to RCA (chronic and previously documented) Total occlusion of proximal to mid native RCA Diffuse disease in the distal obtuse marginal.  Other marginals are totally occluded. Left main is widely patent. LAD contains multiple proximal to distal regions of 50% narrowing but no focal high-grade stenosis is noted.  LAD supplies collaterals to the PDA and there are also faint collaterals to the circumflex. LVEDP 8 mmHg.  Echocardiogram is pending.   Severe diffuse coronary disease with acute occlusion of the saphenous vein graft to the circumflex territory.  Occlusion is occurred sometime within the past 24 to 36 hours.  Cycling of markers will help identify either a rising or falling trend.  With the burden of thrombus present in the graft, intervention did not seem reasonable as it would not likely be a good opportunity to achieve flow of any significant importance.

## 2021-04-22 NOTE — Telephone Encounter (Signed)
Spoke with patient he states Monday he took a NTG, it helped the chest pain. He seen his PCP on Tuesday for a sinus issues, on Wednesday and today the chest pain has been bad. He has only taken one NTG today. Patient states I am still having the pressure/pain right now "but it is not as bad, it has kind of eased off" "I had some arm pain yesterday, it felt like a muscle pain." Patient encouraged to go to the Emergency Department. He verbalized understanding and said he would go to [facility]. Patient has not seen Dr. Agustin Cree since 2020. Patient set up with an appointment.

## 2021-04-22 NOTE — Telephone Encounter (Signed)
Pt c/o of Chest Pain: STAT if CP now or developed within 24 hours  1. Are you having CP right now? yes  2. Are you experiencing any other symptoms (ex. SOB, nausea, vomiting, sweating)? Sweating   3. How long have you been experiencing CP? Monday afternoon  4. Is your CP continuous or coming and going? Coming and going  5. Have you taken Nitroglycerin? yes ?

## 2021-04-22 NOTE — Telephone Encounter (Signed)
Patient's daughter called. She states she wants me to call her mother because she is having a hard time at the Emergency department.

## 2021-04-22 NOTE — Interval H&P Note (Signed)
Cath Lab Visit (complete for each Cath Lab visit)  Clinical Evaluation Leading to the Procedure:   ACS: Yes.    Non-ACS:    Anginal Classification: CCS Garrison  Anti-ischemic medical therapy: Maximal Therapy (2 or more classes of medications)  Non-Invasive Test Results: No non-invasive testing performed  Prior CABG: Previous CABG      History and Physical Interval Note:  04/22/2021 3:16 PM  Peter Garrison  has presented today for surgery, with the diagnosis of chest pain.  The various methods of treatment have been discussed with the patient and family. After consideration of risks, benefits and other options for treatment, the patient has consented to  Procedure(s): LEFT HEART CATH AND CORS/GRAFTS ANGIOGRAPHY (N/A) as a surgical intervention.  The patient's history has been reviewed, patient examined, no change in status, stable for surgery.  I have reviewed the patient's chart and labs.  Questions were answered to the patient's satisfaction.     Peter Garrison

## 2021-04-22 NOTE — Telephone Encounter (Signed)
Spoke with patient, see chart.    

## 2021-04-22 NOTE — H&P (Addendum)
Cardiology Admission History and Physical:   Patient ID: Peter Garrison MRN: [id number]; DOB: 17-Jul-1961   Admission date: 04/22/2021  PCP:  Dellia Beckwith, [place] Prov[facility] Cardiologist:  Jenne Campus, MD        Chief Complaint:  chest pain  Patient Profile:   Peter Garrison is a 60 y.o. male with hx of CAD and CABG remotely, obesity, HLD, DM-2  who is being seen 04/22/2021 for the evaluation of chest pain.  History of Present Illness:   Peter Garrison pt with hx of CAD and CABG  in past -last nuc ordered but not done for fatigue.  In 05/2019 he had echo with EF 55-60%, mod. Concentric LVH.   He had called with recent chest pain relief with NTG.  Now presents to ER with chest pain for 2-3 days and took 2 NTG enroute without relief. -reported to RN he had 7 prior MIs.  Pain began Monday walking in file and felt uneasy in chest with SOB rested and resolved.   He was seen by PCP on Tuesday with sinus issues placed on ABX.  Yesterday even at rest with chest pain and SOB, at times with diaphoresis.  He did have indigestion butt his pain is more his cardiac pain. Feels like his first MI at age 52 or 79.    I find cath from 2018 with [facility] with all native arteries are diffusely diseased and small.  Pt had DES of VG to OM-1 placed. With plan for DAPT for 1 year.  (VG to OM-1, VG to mLAD, LIMA is not a graft.  In 2016 he had DES to VG to LCX. For cath with  Three vessel CAD - LAD nonobstructive; native RCA and LCx occluded  SVG X 2 occluded; SVG LCx - culprit lesion; LIMa nongrafted  Normal LV systolic function.   EKG:  The ECG that was done 04/22/21 was personally reviewed and demonstrates SB with no acute ST changes, follow up EKGs with SB and [place] PVCs and couplets  Na 136, K+ 5.4 glucose 200 Cr 1.11  Hs troponin 9 and 24 WBC 11, Hgb 17.2 plts 273  Neg COVID  PCXR No acute cardiopulmonary abnormality.  BP 174/95 P 60 to 55 afebrile BP up to 157/91  Placed on  IV heparin. Still with chest pain after 4 mg IV Morphine, IV NTG started.   Past Medical History:  Diagnosis Date   Cancer ([place])    Diabetes mellitus without complication ([place])    Hypertension    MI (myocardial infarction) ([place])     Past Surgical History:  Procedure Laterality Date   APPENDECTOMY     CORONARY ARTERY BYPASS GRAFT     KIDNEY SURGERY     KNEE SURGERY       Medications Prior to Admission: Prior to Admission medications   Medication Sig Start Date End Date Taking? Authorizing Provider  amoxicillin-clavulanate (AUGMENTIN) 875-125 MG tablet Take 1 tablet by mouth 2 (two) times daily. 04/20/21  Yes [provider]  Ascorbic Acid (VITAMIN C PO) Take 1 tablet by mouth daily.   Yes [provider]  aspirin EC 81 MG tablet Take 81 mg by mouth every evening. Swallow whole.   Yes [provider]  busPIRone (BUSPAR) 15 MG tablet Take 15 mg by mouth 2 (two) times daily. 02/27/15  Yes [provider]  Cholecalciferol 50 MCG (2000 UT) TABS Take 1 tablet by mouth daily.   Yes [provider]  famotidine (PEPCID) 20 MG tablet Take 20 mg by mouth every evening.   Yes [provider]  fenofibrate (TRICOR) 48 MG tablet Take 48 mg by mouth daily. 11/14/16  Yes [provider]  glipiZIDE (GLUCOTROL) 10 MG tablet Take 10 mg by mouth daily. 04/06/21  Yes [provider]  HYDROcodone-acetaminophen (NORCO) 10-325 MG tablet Take 1 tablet by mouth 4 (four) times daily as needed for pain. 03/01/15  Yes [provider]  Astrid Drafts 1000 MG CAPS Take 1,000 mg by mouth every evening. 03/01/15  Yes [provider]  lisinopril (PRINIVIL,ZESTRIL) 5 MG tablet Take 5 mg by mouth daily. 02/27/15  Yes [provider]  loratadine (CLARITIN) 10 MG tablet Take 10 mg by mouth every evening.   Yes [provider]  metoprolol tartrate (LOPRESSOR) 25 MG tablet Take 25 mg by mouth 2 (two) times daily. 05/04/15  Yes  [provider]  nitroGLYCERIN (NITROSTAT) 0.4 MG SL tablet Place 0.4 mg under the tongue as needed for chest pain. 11/17/16 04/22/21 Yes [provider]  OZEMPIC, 1 MG/DOSE, 4 MG/3ML SOPN Inject 1 mg into the skin once a week. Mondays 03/04/21  Yes [provider]  pantoprazole (PROTONIX) 40 MG tablet Take 40 mg by mouth every evening. 12/20/20  Yes [provider]  rosuvastatin (CRESTOR) 20 MG tablet Take 20 mg by mouth every evening. 10/14/16  Yes [provider]  ticagrelor (BRILINTA) 90 MG TABS tablet Take 90 mg by mouth 2 (two) times daily. 04/10/17  Yes [provider]  zinc gluconate 50 MG tablet Take 1 tablet by mouth daily.   Yes [provider]     Allergies:    Allergies  Allergen Reactions   Adhesive [Tape]    Percocet [Oxycodone-Acetaminophen]    Prednisone Other (See Comments)    Vision changes    Social History:   Social History   Socioeconomic History   Marital status: Married    Spouse name: Not on file   Number of children: Not on file   Years of education: Not on file   Highest education level: Not on file  Occupational History   Not on file  Tobacco Use   Smoking status: Former   Smokeless tobacco: Current    Types: Snuff  Vaping Use   Vaping Use: Never used  Substance and Sexual Activity   Alcohol use: Not Currently   Drug use: Never   Sexual activity: Not on file  Other Topics Concern   Not on file  Social History Narrative   Not on file   Social Determinants of Health   Financial Resource Strain: Not on file  Food Insecurity: Not on file  Transportation Needs: Not on file  Physical Activity: Not on file  Stress: Not on file  Social Connections: Not on file  Intimate Partner Violence: Not on file    Family History:   The patient's family history includes Aneurysm in his mother; Cancer in his father; Diabetes in his mother.    ROS:  Please see the history of present illness.   General:no colds or fevers, no weight changes Skin:no rashes or ulcers HEENT:no blurred vision, no congestion CV:see HPI PUL:see HPI GI:no diarrhea constipation or melena, no indigestion GU:no hematuria, no dysuria MS:no joint pain, no claudication, chronic back pain Neuro:no syncope, no lightheadedness Endo:+diabetes, no thyroid disease  All other ROS reviewed and negative.     Physical Exam/Data:   Vitals:   04/22/21 1345 04/22/21 1350 04/22/21  1355 04/22/21 1405  BP: (!) 159/81 (!) 152/72 (!) 143/73 (!) 143/75  Pulse: 66 64 (!) 57 (!) 58  Resp: 16 14 14 12   Temp:      TempSrc:      SpO2: 92% 93% 95% 96%  Weight:      Height:       No intake or output data in the 24 hours ending 04/22/21 1413 Last 3 Weights 04/22/2021 05/08/2019 12/17/2018  Weight (lbs) 263 lb 304 lb 288 lb 9.6 oz  Weight (kg) 119.296 kg 137.893 kg 130.908 kg     Body mass index is 41.19 kg/m.  General:  Well nourished, well developed, still with chest pain HEENT: normal Lymph: no adenopathy Neck: no JVD Endocrine:  No thryomegaly Vascular: No carotid bruits; post tib pulses 2+ bilaterally  Cardiac:  normal S1, S2; RRR; no murmur gallup rub or click Lungs:  clear to auscultation bilaterally, no wheezing, rhonchi or rales  Abd: soft, nontender, no hepatomegaly  Ext: no lower ext edema Musculoskeletal:  No deformities, BUE and BLE strength normal and equal Skin: warm and dry  Neuro:  CNs 2-12 intact, no focal abnormalities noted Psych:  Normal affect     Relevant CV Studies: Cardiac cath 12/2016 Successful PCI / Drug Eluting Stent of the SVG to the 1st Obtuse Marginal  Coronary Artery. Complication: transient slow-reflow, without clinical issues.  Interventional Recommendations  Medical therapy for residual CAD.  Medical therapy for CAD Risk factor reduction  DAPT for 1 yr at least   Signatures   Electronically signed by Alinda Money, MD,   FACC(Interventional Physician) on 12/11/2016  16:38   Angiographic Findings   Cardiac Arteries and Lesion Findings  LMCA: 0%, Normal appearance with 0% stenosis and Normal.  LAD: Abnormal.    Lesion on Dist LAD: Mid subsection.40% stenosis 15 mm length . Pre    procedure TIMI III flow was noted. Good run off was present.  LCx: Multiple stenosis.    Lesion on 1st Ob Marg: Ostial.100% stenosis 10 mm length . Pre procedure    TIMI 0 flow was noted. Poor run off was present.Bifurcation lesion.    Lesion on 2nd Ob Marg: Ostial.100% stenosis 10 mm length . Pre procedure    TIMI 0 flow was noted. Poor run off was present.Bifurcation lesion.  RCA: Chronic occlusion.    Lesion on Mid RCA: Proximal subsection.100% stenosis 10 mm length . Pre    procedure TIMI 0 flow was noted. Poor run off was present. The lesion was    diagnosed as High Risk (C).  Graft Lesions    Lesion on Aorta Left to Mid CX: Middle body.99% stenosis 20 mm length    reduced to 0%. Pre procedure TIMI II flow was noted. Post Procedure TIMI    III flow was present. Poor run off was present. The lesion was diagnosed as    Moderate Risk (B).    Devices used    - Abbott 0.014" x 190cm BMW J-Tip PTCI Guidewire. Number of passes: 1.    - 3.5x15 Euphora. 2 inflation(s) to a max pressure of: 10 atm.    - Xience Alpine 4.0x70m Everolimus DES RX. 1 inflation(s) to a max    pressure of: 14 atm.    - Xience Alpine 4.0x113mEverolimus DES RX. 2 inflation(s) to a max    pressure of: 12 atm.  Cardiac Grafts   -  There is a Vein graft that originates at the Aorta Left and attaches  to      the Mid CX. mid graft culprit stenosis   -  There is a Vein graft that originates at the Aorta Right and attaches to      the Dist RCA. CTO   -  There is a Vein graft that originates at the Aorta Left and attaches to      the Mid LAD. destination unknown, probably LAD. CTO      Note: LIMA is not a graft  Cardiac Collaterals    - Moderatecollateral flow from the Prox RCA to the Dist RCA.    -  Poorcollateral flow from the 2nd Diag to the 1st Ob Marg.    - Moderatecollateral flow from the Dist LAD to the Dist RCA.   Echo 05/15/2019  IMPRESSIONS     1. The left ventricle has normal systolic function, with an ejection  fraction of 55-60%. The cavity size was normal. There is moderate  concentric left ventricular hypertrophy. Left ventricular diastolic  Doppler parameters are consistent with impaired  relaxation. No evidence of left ventricular regional wall motion  abnormalities.   2. The right ventricle has normal systolic function. The cavity was  normal. There is no increase in right ventricular wall thickness.   3. The aortic valve is tricuspid. Mild focal calcification of the aortic  valve. Aortic valve regurgitation was not assessed by color flow Doppler.   4. The aorta is normal in size and structure.   5. The aortic root and ascending aorta are normal in size and structure.   6. No evidence of mitral valve stenosis.   FINDINGS   Left Ventricle: The left ventricle has normal systolic function, with an  ejection fraction of 55-60%. The cavity size was normal. There is moderate  concentric left ventricular hypertrophy. Left ventricular diastolic  Doppler parameters are consistent  with impaired relaxation. Normal left ventricular filling pressures No  evidence of left ventricular regional wall motion abnormalities..   Right Ventricle: The right ventricle has normal systolic function. The  cavity was normal. There is no increase in right ventricular wall  thickness.   Left [facility]: Left atrial size was normal in size.   Right [facility]: Right atrial size was normal in size. Right atrial pressure  is estimated at 3 mmHg.   Interatrial Septum: No atrial level shunt detected by color flow Doppler.   Pericardium: There is no evidence of pericardial effusion.   Mitral Valve: The mitral valve is normal in structure. Mitral valve  regurgitation is not visualized by color  flow Doppler. No evidence of  mitral valve stenosis.   Tricuspid Valve: The tricuspid valve is normal in structure. Tricuspid  valve regurgitation is mild by color flow Doppler.   Aortic Valve: The aortic valve is tricuspid Mild calcification of the  aortic valve. Aortic valve regurgitation was not visualized by color flow  Doppler. There is No stenosis of the aortic valve.   Pulmonic Valve: The pulmonic valve was normal in structure. Pulmonic valve  regurgitation is not visualized by color flow Doppler. No evidence of  pulmonic stenosis.   Aorta: The aortic root and ascending aorta are normal in size and  structure. The aorta is normal in size and structure.   Venous: The inferior vena cava measures 1.90 cm, is normal in size with  greater than 50% respiratory variability.  Laboratory Data:  High Sensitivity Troponin:   Recent Labs  Lab 04/22/21 1040 04/22/21 1223  TROPONINIHS 9 24*  Chemistry Recent Labs  Lab 04/22/21 1040  NA 136  K 5.4*  CL 104  CO2 22  GLUCOSE 200*  BUN 16  CREATININE 1.11  CALCIUM 9.9  GFRNONAA >60  ANIONGAP 10    No results for input(s): PROT, ALBUMIN, AST, ALT, ALKPHOS, BILITOT in the last 168 hours. Hematology Recent Labs  Lab 04/22/21 1040  WBC 11.0*  RBC 5.91*  HGB 17.2*  HCT 52.2*  MCV 88.3  MCH 29.1  MCHC 33.0  RDW 13.2  PLT 273   BNPNo results for input(s): BNP, PROBNP in the last 168 hours.  DDimer No results for input(s): DDIMER in the last 168 hours.   Radiology/Studies:  DG Chest Port 1 View  Result Date: 04/22/2021 CLINICAL DATA:  60 year old male with chest pain for 2-3 days. Nitroglycerin without relief. EXAM: PORTABLE CHEST 1 VIEW COMPARISON:  Portable chest 03/01/2015 and earlier. FINDINGS: Portable AP upright view at 1055 hours. Prior CABG. Lung volumes and mediastinal contours are stable and within normal limits. Visualized tracheal air column is within normal limits. Allowing for portable technique the  lungs are clear. No pneumothorax or pleural effusion. No acute osseous abnormality identified. IMPRESSION: No acute cardiopulmonary abnormality. Electronically Signed   By: Genevie Ann M.D.   On: 04/22/2021 11:21     Assessment and Plan:   Chest pain for 2 days with mild increase of troponin, freq PVCs on EKG but no ST changes  (HR on EKG 2020 was 47) with continued pain will proceed with cardiac cath today.  Still with pain despite morphine, dilaudid, heparin and NTG. The patient understands that risks included but are not limited to stroke (1 in 1000), death (1 in 67), kidney failure [usually temporary] (1 in 500), bleeding (1 in 200), allergic reaction [possibly serious] (1 in 200).  CAD with hx CABG and PCI to VG to LCX 2016 and to VG OM 2018.  Last echo in 2020 was normal.  On Brilinta and ASA HTN elevated today 157/91  home meds lisinopril 5 daily, lopressor 25 BID Freq PVCs with hyperkalemia will check Mg+ Hld on tricor and crestor and krill oil  Dm-2 last A1C in May this year 7.4 on glipizide and ozempic will hold for now and use SSI, last dose of Ozempic Tuesday so should be covered mostly Sinus infection placed on Augmentin will only give amoxicillin 500 every 8 hours for now.  GERD on protonix and pepcid, he does have indigestion but also angina.    Risk Assessment/Risk Scores:    HEAR Score (for undifferentiated chest pain):  HEAR Score: 6       Severity of Illness: The appropriate patient status for this patient is INPATIENT. Inpatient status is judged to be reasonable and necessary in order to provide the required intensity of service to ensure the patient's safety. The patient's presenting symptoms, physical exam findings, and initial radiographic and laboratory data in the context of their chronic comorbidities is felt to place them at high risk for further clinical deterioration. Furthermore, it is not anticipated that the patient will be medically stable for discharge from the  hospital within 2 midnights of admission. The following factors support the patient status of inpatient.   " The patient's presenting symptoms include significant chest pain, like his prior MIs . " The worrisome physical exam findings include chest pain with associated SOB . " The initial radiographic and laboratory data are worrisome because of tr elevation of troponin, + PVCs on EKG with no hx . "  The chronic co-morbidities include DM-2, known CAD and graft failure and others with stents.      * I certify that at the point of admission it is my clinical judgment that the patient will require inpatient hospital care spanning beyond 2 midnights from the point of admission due to high intensity of service, high risk for further deterioration and high frequency of surveillance required.*   For questions or updates, please contact [place] Please consult [email or web address] for contact info under     Signed, Cecilie Kicks, NP  04/22/2021 2:13 PM    History and all data above reviewed.  Patient examined.  I agree with the findings as above.  The patient presents with chest pain identical to his previous angina.  It has been 10/10 and ongoing over the past day with some improvement with NTG.  He has had some SOB.  This is mid sternal and not radiating to his arm or jaw.   Initial troponin is negative but there is a mild increase in the second.  EKG without acute ST changes but he has had frequent ventricular ectopy.  Of note he has had a long history of feeling his pulse skipping but this does not bother him.  I was able to review the report of a 2108 cath.  He had severe diffuse native vessel CAD by their description and 2 occluded SVGs.  He had stenting of an SVG to an OM.  He did not have a LIMA graft.   The patient exam reveals COR:RRR  ,  Lungs: Clear  ,  Abd: Positive bowel sounds, no rebound no guarding, Ext No edema  .  All available labs, radiology testing, previous records reviewed. Agree with  documented assessment and plan.     Unstable angina:  Needs cardiac cath. The patient understands that risks included but are not limited to stroke (1 in 1000), death (1 in 90), kidney failure [usually temporary] (1 in 500), bleeding (1 in 200), allergic reaction [possibly serious] (1 in 200).  The patient understands and agrees to proceed.   OK for a right radial cath.    Jeneen Rinks Renn Stille  2:37 PM  04/22/2021

## 2021-04-22 NOTE — ED Triage Notes (Signed)
C/O Chest pain onset 2-3 days ago , states he took 2 NTG enroute without relief, states he has had 7 MI's in the past and this pain feels the same.

## 2021-04-23 ENCOUNTER — Other Ambulatory Visit (HOSPITAL_COMMUNITY): Payer: Self-pay

## 2021-04-23 ENCOUNTER — Inpatient Hospital Stay (HOSPITAL_COMMUNITY): Payer: Medicare Other

## 2021-04-23 ENCOUNTER — Encounter (HOSPITAL_COMMUNITY): Payer: Self-pay | Admitting: Interventional Cardiology

## 2021-04-23 DIAGNOSIS — R079 Chest pain, unspecified: Secondary | ICD-10-CM | POA: Diagnosis not present

## 2021-04-23 LAB — BASIC METABOLIC PANEL
Anion gap: 9 (ref 5–15)
BUN: 12 mg/dL (ref 6–20)
CO2: 22 mmol/L (ref 22–32)
Calcium: 9.2 mg/dL (ref 8.9–10.3)
Chloride: 101 mmol/L (ref 98–111)
Creatinine, Ser: 1.06 mg/dL (ref 0.61–1.24)
GFR, Estimated: >60 mL/min (ref >60)
Glucose, Bld: 221 mg/dL — ABNORMAL HIGH (ref 70–99)
Potassium: 4.2 mmol/L (ref 3.5–5.1)
Sodium: 132 mmol/L — ABNORMAL LOW (ref 135–145)

## 2021-04-23 LAB — CBC
HCT: 44.9 % (ref 39.0–52.0)
Hemoglobin: 15.1 g/dL (ref 13.0–17.0)
MCH: 29.3 pg (ref 26.0–34.0)
MCHC: 33.6 g/dL (ref 30.0–36.0)
MCV: 87.2 fL (ref 80.0–100.0)
Platelets: 229 10*3/uL (ref 150–400)
RBC: 5.15 MIL/uL (ref 4.22–5.81)
RDW: 13.4 % (ref 11.5–15.5)
WBC: 13.4 10*3/uL — ABNORMAL HIGH (ref 4.0–10.5)
nRBC: 0 % (ref 0.0–0.2)

## 2021-04-23 LAB — ECHOCARDIOGRAM COMPLETE
AR max vel: 2.05 cm2
AV Area VTI: 2.2 cm2
AV Area mean vel: 2.34 cm2
AV Mean grad: 9 mmHg
AV Peak grad: 16.6 mmHg
Ao pk vel: 2.04 m/s
Area-P 1/2: 3.77 cm2
Height: 67 in
S' Lateral: 3.7 cm
Weight: 4208 oz

## 2021-04-23 LAB — GLUCOSE, CAPILLARY
Glucose-Capillary: 235 mg/dL — ABNORMAL HIGH (ref 70–99)
Glucose-Capillary: 230 mg/dL — ABNORMAL HIGH (ref 70–99)
Glucose-Capillary: 218 mg/dL — ABNORMAL HIGH (ref 70–99)
Glucose-Capillary: 260 mg/dL — ABNORMAL HIGH (ref 70–99)

## 2021-04-23 LAB — LIPID PANEL
Cholesterol: 126 mg/dL (ref 0–200)
HDL: 30 mg/dL — ABNORMAL LOW (ref >40)
LDL Cholesterol: 64 mg/dL (ref 0–99)
Total CHOL/HDL Ratio: 4.2 RATIO
Triglycerides: 161 mg/dL — ABNORMAL HIGH (ref <150)
VLDL: 32 mg/dL (ref 0–40)

## 2021-04-23 LAB — HEPARIN LEVEL (UNFRACTIONATED): Heparin Unfractionated: <0.1 IU/mL — ABNORMAL LOW (ref 0.30–0.70)

## 2021-04-23 LAB — TROPONIN I (HIGH SENSITIVITY): Troponin I (High Sensitivity): 17949 ng/L (ref <18)

## 2021-04-23 MED ORDER — CARVEDILOL 6.25 MG PO TABS
6.2500 mg | ORAL_TABLET | Freq: Two times a day (BID) | ORAL | Status: DC
  Administered 2021-04-23 – 2021-04-24 (×2): 6.25 mg via ORAL
  Filled 2021-04-23 (×2): qty 1

## 2021-04-23 MED ORDER — ISOSORBIDE MONONITRATE ER 30 MG PO TB24
30.0000 mg | ORAL_TABLET | Freq: Every day | ORAL | Status: DC
  Administered 2021-04-23: 30 mg via ORAL
  Filled 2021-04-23: qty 1

## 2021-04-23 MED ORDER — PERFLUTREN LIPID MICROSPHERE
1.0000 mL | INTRAVENOUS | Status: AC | PRN
  Administered 2021-04-23: 2 mL via INTRAVENOUS
  Filled 2021-04-23: qty 10

## 2021-04-23 MED ORDER — ISOSORBIDE MONONITRATE ER 60 MG PO TB24
90.0000 mg | ORAL_TABLET | Freq: Once | ORAL | Status: AC
  Administered 2021-04-23: 90 mg via ORAL
  Filled 2021-04-23: qty 1

## 2021-04-23 MED ORDER — ISOSORBIDE MONONITRATE ER 60 MG PO TB24: 60.0000 mg | ORAL_TABLET | Freq: Every day | ORAL | Status: DC

## 2021-04-23 MED ORDER — ISOSORBIDE MONONITRATE ER 60 MG PO TB24
120.0000 mg | ORAL_TABLET | Freq: Every day | ORAL | Status: DC
  Administered 2021-04-24: 120 mg via ORAL
  Filled 2021-04-23 (×2): qty 2

## 2021-04-23 MED FILL — Verapamil HCl IV Soln 2.5 MG/ML: INTRAVENOUS | Qty: 2 | Status: AC

## 2021-04-23 NOTE — Progress Notes (Signed)
Inpatient Diabetes Program Recommendations  AACE/ADA: New Consensus Statement on Inpatient Glycemic Control (2015)  Target Ranges:  Prepandial:   less than 140 mg/dL      Peak postprandial:   less than 180 mg/dL (1-2 hours)      Critically ill patients:  140 - 180 mg/dL   Lab Results  Component Value Date   GLUCAP 235 (H) 04/23/2021   HGBA1C 7.2 (H) 04/22/2021    Review of Glycemic Control  Diabetes history: DM 2 Outpatient Diabetes medications: Glipizide 10 mg Daily, Ozempic 1 mg QMondays Current orders for Inpatient glycemic control:  Novolog 0-6 units tid + hs  A1c 7.2% on 7/14  Inpatient Diabetes Program Recommendations:    -  consider Novolog 2 units tid meal coverage if pt is eating >50% of meals  Thanks,  Tama Headings RN, MSN, BC-ADM Inpatient Diabetes Coordinator Team Pager [phone] (8a-5p)

## 2021-04-23 NOTE — Progress Notes (Signed)
[place] for heparin Indication: chest pain/ACS  Allergies  Allergen Reactions   Adhesive [Tape]    Percocet [Oxycodone-Acetaminophen]    Prednisone Other (See Comments)    Vision changes    Patient Measurements: Height: 5\' 7"  (170.2 cm) Weight: 119.3 kg (263 lb) IBW/kg (Calculated) : 66.1 Heparin Dosing Weight: 93.6 kg  Vital Signs: Temp: 98.5 F (36.9 C) (07/15 0735) Temp Source: Oral (07/15 0735) BP: 111/63 (07/15 0735) Pulse Rate: 68 (07/15 0735)  Labs: Recent Labs    04/22/21 1040 04/22/21 1223 04/22/21 1259 04/22/21 1332 04/22/21 1844 04/23/21 0604  HGB 17.2*  --   --   --   --  15.1  HCT 52.2*  --   --   --   --  44.9  PLT 273  --   --   --   --  229  APTT  --   --  106*  --   --   --   LABPROT  --   --  14.4  --   --   --   INR  --   --  1.1  --   --   --   HEPARINUNFRC  --   --   --   --   --  <0.10*  CREATININE 1.11  --   --  1.06  --  1.06  TROPONINIHS 9 24*  --   --  8,086*  --      Estimated Creatinine Clearance: 92.8 mL/min (by C-G formula based on SCr of 1.06 mg/dL).   Medical History: Past Medical History:  Diagnosis Date   Cancer ([place])    Diabetes mellitus without complication ([place])    Hypertension    MI (myocardial infarction) [facility])    Assessment: 60 yo M with CP (hx of prior MI and CABG), took 3 SL NTG and CP improved but still hurting, denies SOB. No AC PTA. He is s/p cath with severe CAD and heparin continued post procedure.  -heparin level < 0.1  Goal of Therapy:  Heparin level 0.3-0.7 units/ml Monitor platelets by anticoagulation protocol: Yes   Plan:  -Increase heparin to 1500 units/hr -recheck heparin level in 6 hours  Hildred Laser, PharmD Clinical Pharmacist **Pharmacist phone directory can now be found on [email or web address] ([email or web address]).  Listed under [place].

## 2021-04-23 NOTE — Progress Notes (Addendum)
Date and time results received: 04/22/2021 21:42  Test: Troponin Critical Value: 8,086  Name of Provider Notified: Paticia Stack, MD  Orders Received? Or Actions Taken?:  MD aware, confirmed heparin to be restarted and nitroglycerin order active

## 2021-04-23 NOTE — Progress Notes (Signed)
*  PRELIMINARY RESULTS* Echocardiogram 2D Echocardiogram has been performed with [facility].  Luisa Hart RDCS 04/23/2021, 12:33 PM

## 2021-04-23 NOTE — TOC Benefit Eligibility Note (Signed)
Patient Teacher, English as a foreign language completed.    The patient is currently admitted and upon discharge could be taking Farxiga 10 mg.  The current 30 day co-pay is, $0.00.   The patient is currently admitted and upon discharge could be taking Jardiance 10 mg.  The current 30 day co-pay is, $0.00.   The patient is insured through [place], [place] Patient Advocate Specialist [place] Team Direct Number: [phone]  Fax: [phone]

## 2021-04-23 NOTE — Progress Notes (Addendum)
Progress Note  Patient Name: Peter Garrison Date of Encounter: 04/23/2021  Primary Cardiologist: Jenne Campus, MD       Subjective   No recurrent chest pain. Still on Heparin and IV NTG>>>stop and transition to home Brilinta and ASA>>add IMDUR. Pt and family dissatisfied with the results of his cath yesterday. Unclear why there is no option other than medical therapy   Inpatient Medications    Scheduled Meds:  amoxicillin  500 mg Oral Q8H   aspirin EC  81 mg Oral QPM   busPIRone  15 mg Oral BID   fenofibrate  54 mg Oral Daily   insulin aspart  0-5 Units Subcutaneous QHS   insulin aspart  0-6 Units Subcutaneous TID WC   lisinopril  5 mg Oral Daily   loratadine  10 mg Oral QPM   metoprolol tartrate  25 mg Oral BID   pantoprazole  40 mg Oral QPM   rosuvastatin  20 mg Oral QPM   sodium chloride flush  3 mL Intravenous Q12H   sodium chloride flush  3 mL Intravenous Q12H   ticagrelor  90 mg Oral BID   Continuous Infusions:  sodium chloride     heparin 1,200 Units/hr (04/23/21 0009)   nitroGLYCERIN 100 mcg/min (04/23/21 0709)   PRN Meds: sodium chloride, acetaminophen, HYDROcodone-acetaminophen, nitroGLYCERIN, ondansetron (ZOFRAN) IV, sodium chloride flush, zolpidem   Vital Signs    Vitals:   04/22/21 2226 04/23/21 0005 04/23/21 0520 04/23/21 0735  BP: (!) 148/73 137/81 130/80 111/63  Pulse: 70 65 66 68  Resp:  18 19 18   Temp:  98.2 F (36.8 C) 97.8 F (36.6 C) 98.5 F (36.9 C)  TempSrc:  Oral Oral Oral  SpO2:  100% 95% 98%  Weight:      Height:        Intake/Output Summary (Last 24 hours) at 04/23/2021 0801 Last data filed at 04/23/2021 0600 Gross per 24 hour  Intake 1024.94 ml  Output --  Net 1024.94 ml   Filed Weights   04/22/21 1026  Weight: 119.3 kg    Physical Exam   General: Obese, NAD Neck: Negative for carotid bruits. No JVD Lungs:Clear to ausculation bilaterally. Breathing is unlabored. Cardiovascular: RRR with S1 S2. No  murmurs Abdomen: Soft, non-tender, non-distended. No obvious abdominal masses. Groin site stable.  Extremities: No edema.  Neuro: Alert and oriented. No focal deficits. No facial asymmetry. MAE spontaneously. Psych: Responds to questions appropriately with normal affect.    Labs    Chemistry Recent Labs  Lab 04/22/21 1040 04/22/21 1332 04/23/21 0604  NA 136 137 132*  K 5.4* 4.4 4.2  CL 104 105 101  CO2 22 23 22   GLUCOSE 200* 180* 221*  BUN 16 14 12   CREATININE 1.11 1.06 1.06  CALCIUM 9.9 9.6 9.2  PROT  --  7.1  --   ALBUMIN  --  3.8  --   AST  --  31  --   ALT  --  25  --   ALKPHOS  --  62  --   BILITOT  --  0.8  --   GFRNONAA >60 >60 >60  ANIONGAP 10 9 9      Hematology Recent Labs  Lab 04/22/21 1040 04/23/21 0604  WBC 11.0* 13.4*  RBC 5.91* 5.15  HGB 17.2* 15.1  HCT 52.2* 44.9  MCV 88.3 87.2  MCH 29.1 29.3  MCHC 33.0 33.6  RDW 13.2 13.4  PLT 273 229    Cardiac EnzymesNo  results for input(s): TROPONINI in the last 168 hours. No results for input(s): TROPIPOC in the last 168 hours.   BNPNo results for input(s): BNP, PROBNP in the last 168 hours.   DDimer No results for input(s): DDIMER in the last 168 hours.   Radiology    CARDIAC CATHETERIZATION  Result Date: 04/22/2021 Recent thrombotic occlusion of saphenous vein graft to the circumflex/marginal system.  Culprit vessel for presentation.  At the time of presentation no chest pain, no acute EKG changes, and presumed occlusion within the past 36 hours. 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 Total occlusion of native RCA Severe diffuse disease in native circumflex including total occlusion of the distalmost obtuse marginal and severe diffuse disease and a moderate sized distal obtuse marginal with evidence of prior stenting with total occlusion also noted. Widely patent left main Moderately severe diffuse atherosclerosis from proximal to distal  LAD without high-grade obstructive disease. LVEDP is normal at 8 mmHg. RECOMMENDATIONS: Medical management. In Cath Lab patient was essentially pain-free and the culprit vessel is a previously stented 60 year old graft without any chance of meaningful patency.  Therefore intervention was not indicated and could potentially cause harm. Discussed with Dr. Percival Spanish.  DG Chest Port 1 View  Result Date: 04/22/2021 CLINICAL DATA:  60 year old male with chest pain for 2-3 days. Nitroglycerin without relief. EXAM: PORTABLE CHEST 1 VIEW COMPARISON:  Portable chest 03/01/2015 and earlier. FINDINGS: Portable AP upright view at 1055 hours. Prior CABG. Lung volumes and mediastinal contours are stable and within normal limits. Visualized tracheal air column is within normal limits. Allowing for portable technique the lungs are clear. No pneumothorax or pleural effusion. No acute osseous abnormality identified. IMPRESSION: No acute cardiopulmonary abnormality. Electronically Signed   By: Genevie Ann M.D.   On: 04/22/2021 11:21    Telemetry    04/23/21 NSR with HR  60-70- Personally Reviewed  ECG    NO new tracing as of 04/23/21 - Personally Reviewed  Cardiac Studies   LHC 04/22/21: Recent thrombotic occlusion of saphenous vein graft to the circumflex/marginal system.  Culprit vessel for presentation.  At the time of presentation no chest pain, no acute EKG changes, and presumed occlusion within the past 36 hours. 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 Total occlusion of native RCA Severe diffuse disease in native circumflex including total occlusion of the distalmost obtuse marginal and severe diffuse disease and a moderate sized distal obtuse marginal with evidence of prior stenting with total occlusion also noted. Widely patent left main Moderately severe diffuse atherosclerosis from proximal to distal LAD without high-grade obstructive  disease. LVEDP is normal at 8 mmHg.   RECOMMENDATIONS:   Medical management. In Cath Lab patient was essentially pain-free and the culprit vessel is a previously stented 60 year old graft without any chance of meaningful patency.  Therefore intervention was not indicated and could potentially cause harm. Discussed with Dr. Percival Spanish.       Intervention   Echocardiogram pending   Patient Profile     60 y.o. male  with hx of CAD and CABG remotely, obesity, HLD, DM-2  who is being seen 04/22/2021 for the evaluation of chest pain.   Assessment & Plan    1. Chest pain with known hx of CAD: -Hx of CABG and PCI to SVG to LCX 2016 and to VG OM 2018>>on Brilinta and ASA who presented with a 2 day  hx of chest pain>>plan was for [facility] performed 04/22/21>>thrombotic occlusion of saphenous vein graft to the circumflex/marginal system felt to be the culprit vessel for presentation.  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 Total occlusion of native RCA and severe diffuse disease in native circumflex. Widely patent left main. -Recommendations for medical management given culprit vessel is a previously stented 60 year old graft without any chance of meaningful patency -Currently on IV Heparin and IV NTG>>>plan to d/c and restart home ASA and Brilinta.  -Add IMDUR 30 once IV NTG stopped. Titrate up as needed/BP tolerates  -Continue Crestor -Change metoprolol to carvedilol   2. HTN: -Stable, 11/63>>130/80 -Continue current regimen with lisinopril and metoprolol  -Change metoprolol to carvedilol  -Consider d/c lisinopril to allow more BP for titration of above   3. PVCs: -Tele shows NSR with no significant PVCs -Electrolytes stable   4. HLD: -Last LDL, 64 on 04/23/21>>>within goal of <70 -Continue statin at current dose  5. DM2: -HbA1c, 7.2>>use to be 11.0 -Started on Ozempic 9 months ago with significant weight  loss  -Follows closely with PCP   6. Sinus infection: -Treated with amoxicillin 500 every 8 hours for now  Signed, Kathyrn Drown NP-C [place] Pager: [phone] 04/23/2021, 8:01 AM     For questions or updates, please contact   Please consult [email or web address] for contact info under Cardiology/STEMI.   History and all data above reviewed.  Patient examined.  I agree with the findings as above.    No pain.  Off IV NTG .  The patient exam reveals COR:RRR  ,  Lungs: Clear  ,  Abd: Positive bowel sounds, no rebound no guarding, Ext Right radial with mild edema but no bruising or bleeding  .  All available labs, radiology testing, previous records reviewed. Agree with documented assessment and plan. NQWMI:  Case reviewed with Dr. Tamala Julian.  Long discussion with the patient.  The SVG to the OK was the culprit.  However the only remaining OM which was likely the previously bypassed vessel is diffusely diseased and does not offer much run off and the SVG had a large burden of clot.  The chance of opening this or maintaining patency was deemed to be very low and a high risk procedure.  His RCA and OM are not graftable.  Needs medical management and risk reduction.  I will increase the Imdur to 120 mg.  Needs diet, DM and lipid control. Ambulate today and make sure he is pain free.  Probable discharge in AM.  Tinika Bucknam  11:00 AM  04/23/2021

## 2021-04-24 DIAGNOSIS — E1165 Type 2 diabetes mellitus with hyperglycemia: Secondary | ICD-10-CM

## 2021-04-24 DIAGNOSIS — Z951 Presence of aortocoronary bypass graft: Secondary | ICD-10-CM

## 2021-04-24 LAB — CBC
HCT: 44.3 % (ref 39.0–52.0)
Hemoglobin: 15.5 g/dL (ref 13.0–17.0)
MCH: 30 pg (ref 26.0–34.0)
MCHC: 35 g/dL (ref 30.0–36.0)
MCV: 85.7 fL (ref 80.0–100.0)
Platelets: 203 10*3/uL (ref 150–400)
RBC: 5.17 MIL/uL (ref 4.22–5.81)
RDW: 13.2 % (ref 11.5–15.5)
WBC: 12.5 10*3/uL — ABNORMAL HIGH (ref 4.0–10.5)
nRBC: 0 % (ref 0.0–0.2)

## 2021-04-24 LAB — GLUCOSE, CAPILLARY
Glucose-Capillary: 213 mg/dL — ABNORMAL HIGH (ref 70–99)
Glucose-Capillary: 325 mg/dL — ABNORMAL HIGH (ref 70–99)

## 2021-04-24 MED ORDER — AMOXICILLIN 500 MG PO CAPS: 500.0000 mg | ORAL_CAPSULE | Freq: Three times a day (TID) | ORAL | 0 refills | Status: DC

## 2021-04-24 MED ORDER — CARVEDILOL 6.25 MG PO TABS: 6.2500 mg | ORAL_TABLET | Freq: Two times a day (BID) | ORAL | 5 refills | Status: DC

## 2021-04-24 MED ORDER — FENOFIBRATE 54 MG PO TABS: 54.0000 mg | ORAL_TABLET | Freq: Every day | ORAL | 1 refills | Status: DC

## 2021-04-24 MED ORDER — NITROGLYCERIN 0.4 MG SL SUBL: 0.4000 mg | SUBLINGUAL_TABLET | SUBLINGUAL | 3 refills | Status: DC | PRN

## 2021-04-24 MED ORDER — ISOSORBIDE MONONITRATE ER 120 MG PO TB24: 120.0000 mg | ORAL_TABLET | Freq: Every day | ORAL | 1 refills | Status: DC

## 2021-04-24 NOTE — Progress Notes (Signed)
Progress Note  Patient Name: Peter Garrison Date of Encounter: 04/24/2021  [facility] Cardiologist: Jenne Campus, MD   Subjective   NAEO. Chest pain improved. Ready to be discharged.  Inpatient Medications    Scheduled Meds:  amoxicillin  500 mg Oral Q8H   aspirin EC  81 mg Oral QPM   busPIRone  15 mg Oral BID   carvedilol  6.25 mg Oral BID WC   fenofibrate  54 mg Oral Daily   insulin aspart  0-5 Units Subcutaneous QHS   insulin aspart  0-6 Units Subcutaneous TID WC   isosorbide mononitrate  120 mg Oral Daily   lisinopril  5 mg Oral Daily   loratadine  10 mg Oral QPM   pantoprazole  40 mg Oral QPM   rosuvastatin  20 mg Oral QPM   sodium chloride flush  3 mL Intravenous Q12H   sodium chloride flush  3 mL Intravenous Q12H   ticagrelor  90 mg Oral BID   Continuous Infusions:  sodium chloride     PRN Meds: sodium chloride, acetaminophen, HYDROcodone-acetaminophen, nitroGLYCERIN, ondansetron (ZOFRAN) IV, sodium chloride flush, zolpidem   Vital Signs    Vitals:   04/23/21 1522 04/23/21 2032 04/24/21 0512 04/24/21 0831  BP: 132/71 (!) 167/81 (!) 147/88 116/81  Pulse: 71 90 89   Resp: 20 18 18    Temp: 98.7 F (37.1 C) 98.5 F (36.9 C) 98.6 F (37 C)   TempSrc: Oral Oral Oral   SpO2: 96% 90% 98%   Weight:      Height:        Intake/Output Summary (Last 24 hours) at 04/24/2021 0920 Last data filed at 04/24/2021 0800 Gross per 24 hour  Intake 448.56 ml  Output --  Net 448.56 ml   Last 3 Weights 04/22/2021 05/08/2019 12/17/2018  Weight (lbs) 263 lb 304 lb 288 lb 9.6 oz  Weight (kg) 119.296 kg 137.893 kg 130.908 kg      Telemetry    Sinus rhythm with rare PVCs - Personally Reviewed  ECG    Personally Reviewed  Physical Exam   GEN: No acute distress.  Obese. Neck: No JVD Cardiac: RRR, no murmurs, rubs, or gallops. RFA access site without hematoma or pain. Respiratory: Clear to auscultation bilaterally. GI: Soft, nontender, non-distended  MS: No  edema; No deformity. Neuro:  Nonfocal  Psych: Normal affect   Labs    High Sensitivity Troponin:   Recent Labs  Lab 04/22/21 1040 04/22/21 1223 04/22/21 1844 04/23/21 1046  TROPONINIHS 9 24* 8,086* 17,949*      Chemistry Recent Labs  Lab 04/22/21 1040 04/22/21 1332 04/23/21 0604  NA 136 137 132*  K 5.4* 4.4 4.2  CL 104 105 101  CO2 22 23 22   GLUCOSE 200* 180* 221*  BUN 16 14 12   CREATININE 1.11 1.06 1.06  CALCIUM 9.9 9.6 9.2  PROT  --  7.1  --   ALBUMIN  --  3.8  --   AST  --  31  --   ALT  --  25  --   ALKPHOS  --  62  --   BILITOT  --  0.8  --   GFRNONAA >60 >60 >60  ANIONGAP 10 9 9      Hematology Recent Labs  Lab 04/22/21 1040 04/23/21 0604 04/24/21 0227  WBC 11.0* 13.4* 12.5*  RBC 5.91* 5.15 5.17  HGB 17.2* 15.1 15.5  HCT 52.2* 44.9 44.3  MCV 88.3 87.2 85.7  MCH 29.1 29.3 30.0  MCHC 33.0 33.6 35.0  RDW 13.2 13.4 13.2  PLT 273 229 203    BNPNo results for input(s): BNP, PROBNP in the last 168 hours.   DDimer No results for input(s): DDIMER in the last 168 hours.   Radiology    CARDIAC CATHETERIZATION  Result Date: 04/22/2021 Recent thrombotic occlusion of saphenous vein graft to the circumflex/marginal system.  Culprit vessel for presentation.  At the time of presentation no chest pain, no acute EKG changes, and presumed occlusion within the past 36 hours. 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 Total occlusion of native RCA Severe diffuse disease in native circumflex including total occlusion of the distalmost obtuse marginal and severe diffuse disease and a moderate sized distal obtuse marginal with evidence of prior stenting with total occlusion also noted. Widely patent left main Moderately severe diffuse atherosclerosis from proximal to distal LAD without high-grade obstructive disease. LVEDP is normal at 8 mmHg. RECOMMENDATIONS: Medical management. In Cath Lab patient was  essentially pain-free and the culprit vessel is a previously stented 60 year old graft without any chance of meaningful patency.  Therefore intervention was not indicated and could potentially cause harm. Discussed with Dr. Percival Spanish.  DG Chest Port 1 View  Result Date: 04/22/2021 CLINICAL DATA:  60 year old male with chest pain for 2-3 days. Nitroglycerin without relief. EXAM: PORTABLE CHEST 1 VIEW COMPARISON:  Portable chest 03/01/2015 and earlier. FINDINGS: Portable AP upright view at 1055 hours. Prior CABG. Lung volumes and mediastinal contours are stable and within normal limits. Visualized tracheal air column is within normal limits. Allowing for portable technique the lungs are clear. No pneumothorax or pleural effusion. No acute osseous abnormality identified. IMPRESSION: No acute cardiopulmonary abnormality. Electronically Signed   By: Genevie Ann M.D.   On: 04/22/2021 11:21   ECHOCARDIOGRAM COMPLETE  Result Date: 04/23/2021    ECHOCARDIOGRAM REPORT   Patient Name:   Peter Garrison Date of Exam: 04/23/2021 Medical Rec #:  [id number]    Height:       67.0 in Accession #:    [id number]   Weight:       263.0 lb Date of Birth:  1961/06/26    BSA:          2.272 m Patient Age:    60 years     BP:           139/78 mmHg Patient Gender: M            HR:           67 bpm. Exam Location:  Inpatient Procedure: 2D Echo, Cardiac Doppler, Color Doppler and Intracardiac            Opacification Agent Indications:    chest pain  History:        Patient has prior history of Echocardiogram examinations, most                 recent 05/15/2019. CAD, Prior CABG; Risk Factors:Diabetes, Sleep                 Apnea and Dyslipidemia.  Sonographer:    Luisa Hart RDCS Referring Phys: [place]  1. Left ventricular ejection fraction, by estimation, is 60 to 65%. The left ventricle has normal function. The left ventricle has no regional wall motion abnormalities. There is moderate hypertrophy of the basal-septum.  The rest of the LV segments demonstrate mild left ventricular hypertrophy. Left ventricular diastolic parameters  are consistent with Grade II diastolic dysfunction (pseudonormalization). Elevated left atrial pressure.  2. Right ventricular systolic function is normal. The right ventricular size is normal.  3. The mitral valve is normal in structure. No evidence of mitral valve regurgitation.  4. The aortic valve is tricuspid. There is mild calcification of the aortic valve. There is mild thickening of the aortic valve. Aortic valve regurgitation is not visualized. Mild to moderate aortic valve sclerosis/calcification is present, without any evidence of aortic stenosis. Comparison(s): Compared to prior TTE in 2020, there is no significant change. FINDINGS  Left Ventricle: Left ventricular ejection fraction, by estimation, is 60 to 65%. The left ventricle has normal function. The left ventricle has no regional wall motion abnormalities. Definity contrast agent was given IV to delineate the left ventricular  endocardial borders. The left ventricular internal cavity size was normal in size. There is moderate hypertrophy of the basal-septum. The rest of the LV segments demonstrate mild left ventricular hypertrophy. Left ventricular diastolic parameters are consistent with Grade II diastolic dysfunction (pseudonormalization). Elevated left atrial pressure. Right Ventricle: The right ventricular size is normal. Right vetricular wall thickness was not well visualized. Right ventricular systolic function is normal. Left Atrium: Left atrial size was normal in size. Right Atrium: Right atrial size was normal in size. Pericardium: There is no evidence of pericardial effusion. Mitral Valve: The mitral valve is normal in structure. No evidence of mitral valve regurgitation. Tricuspid Valve: The tricuspid valve is normal in structure. Tricuspid valve regurgitation is trivial. Aortic Valve: The aortic valve is tricuspid. There is  mild calcification of the aortic valve. There is mild thickening of the aortic valve. Aortic valve regurgitation is not visualized. Mild to moderate aortic valve sclerosis/calcification is present, without any evidence of aortic stenosis. Aortic valve mean gradient measures 9.0 mmHg. Aortic valve peak gradient measures 16.6 mmHg. Aortic valve area, by VTI measures 2.20 cm. Pulmonic Valve: The pulmonic valve was not well visualized. Pulmonic valve regurgitation is not visualized. Aorta: The aortic root and ascending aorta are structurally normal, with no evidence of dilitation. Venous: The inferior vena cava was not well visualized. IAS/Shunts: No atrial level shunt detected by color flow Doppler.  LEFT VENTRICLE PLAX 2D LVIDd:         5.60 cm  Diastology LVIDs:         3.70 cm  LV e' medial:    4.24 cm/s LV PW:         1.00 cm  LV E/e' medial:  25.7 LV IVS:        1.00 cm  LV e' lateral:   6.74 cm/s LVOT diam:     2.00 cm  LV E/e' lateral: 16.2 LV SV:         85 LV SV Index:   37 LVOT Area:     3.14 cm  RIGHT VENTRICLE RV Basal diam:  4.70 cm     PULMONARY VEINS RV Mid diam:    2.00 cm     A Reversal Duration: 100.00 msec RV S prime:     16.80 cm/s  A Reversal Velocity: 24.60 cm/s TAPSE (M-mode): 2.1 cm      Diastolic Velocity:  81.85 cm/s                             S/D Velocity:        1.20  Systolic Velocity:   20.25 cm/s LEFT ATRIUM           Index       RIGHT ATRIUM           Index LA diam:      3.50 cm 1.54 cm/m  RA Area:     14.10 cm LA Vol (A4C): 53.0 ml 23.33 ml/m RA Volume:   36.50 ml  16.07 ml/m  AORTIC VALVE                    PULMONIC VALVE AV Area (Vmax):    2.05 cm     PV Vmax:       1.23 m/s AV Area (Vmean):   2.34 cm     PV Vmean:      83.950 cm/s AV Area (VTI):     2.20 cm     PV VTI:        0.232 m AV Vmax:           204.00 cm/s  PV Peak grad:  6.1 mmHg AV Vmean:          134.000 cm/s PV Mean grad:  3.5 mmHg AV VTI:            0.385 m AV Peak Grad:      16.6  mmHg AV Mean Grad:      9.0 mmHg LVOT Vmax:         133.00 cm/s LVOT Vmean:        99.900 cm/s LVOT VTI:          0.269 m LVOT/AV VTI ratio: 0.70  AORTA Ao Root diam: 3.20 cm Ao Asc diam:  3.30 cm MITRAL VALVE                TRICUSPID VALVE MV Area (PHT): 3.77 cm     TR Peak grad:   36.2 mmHg MV Decel Time: 201 msec     TR Vmax:        301.00 cm/s MV E velocity: 109.00 cm/s MV A velocity: 105.00 cm/s  SHUNTS MV E/A ratio:  1.04         Systemic VTI:  0.27 m                             Systemic Diam: 2.00 cm Gwyndolyn Kaufman MD Electronically signed by Gwyndolyn Kaufman MD Signature Date/Time: 04/23/2021/4:12:56 PM    Final     Cardiac Studies   LHC personally reviewed - severe native CAD and 100% occlusions of the SVT-LAD and SVG-RCA and SVG-LCx    Assessment & Plan   Peter Garrison is a pleasant 60yo man with severe CAD s/p CABG admitted with chest pain.  #Chest Pain / CAD Extensive PCI and CABG history. [place] 04/23/2021 yesterday shows occlusion of the SVG-Lcx, SVG-RCA and SVG-LAD. - medical management recommended - imdur added and seems to be working well. Pain controlled with ambulation. - cont crestor - cont coreg  #HTN - controlled  #PVC Rare  #HLD Cont statin  #DM Fairly controlled. Cont PCP follow up.  OK to discharge today.     For questions or updates, please contact [place] Please consult [email or web address] for contact info under        Signed, Vickie Epley, MD  04/24/2021, 9:20 AM

## 2021-04-24 NOTE — Discharge Summary (Signed)
Discharge Summary    Patient ID: Peter Garrison MRN: [id number]; DOB: 05-28-1961  Admit date: 04/22/2021 Discharge date: 04/24/2021  PCP:  Dellia Beckwith, PA-C   [facility] Cardiologist:  Jenne Campus, MD        Discharge Diagnoses    Principal Problem:   MI, acute, non ST segment elevation [facility]) Active Problems:   Obesity due to excess calories   Obstructive sleep apnea   S/P CABG (coronary artery bypass graft)   Type 2 diabetes mellitus with hyperglycemia, without long-term current use of insulin (HCC)   Unstable angina ([place])    Diagnostic Studies/Procedures    Echo 04/23/2021 1. Left ventricular ejection fraction, by estimation, is 60 to 65%. The  left ventricle has normal function. The left ventricle has no regional  wall motion abnormalities. There is moderate hypertrophy of the  basal-septum. The rest of the LV segments  demonstrate mild left ventricular hypertrophy. Left ventricular diastolic  parameters are consistent with Grade II diastolic dysfunction  (pseudonormalization). Elevated left atrial pressure.   2. Right ventricular systolic function is normal. The right ventricular  size is normal.   3. The mitral valve is normal in structure. No evidence of mitral valve  regurgitation.   4. The aortic valve is tricuspid. There is mild calcification of the  aortic valve. There is mild thickening of the aortic valve. Aortic valve  regurgitation is not visualized. Mild to moderate aortic valve  sclerosis/calcification is present, without any  evidence of aortic stenosis.   Comparison(s): Compared to prior TTE in 2020, there is no significant  change.     Cath 04/22/2021 Recent thrombotic occlusion of saphenous vein graft to the circumflex/marginal system.  Culprit vessel for presentation.  At the time of presentation no chest pain, no acute EKG changes, and presumed occlusion within the past 36 hours. 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 Occlusion of saphenous vein graft to RCA (chronic). Total occlusion of native RCA Severe diffuse disease in native circumflex including total occlusion of the distalmost obtuse marginal and severe diffuse disease and a moderate sized distal obtuse marginal with evidence of prior stenting with total occlusion also noted. Widely patent left main Moderately severe diffuse atherosclerosis from proximal to distal LAD without high-grade obstructive disease. LVEDP is normal at 8 mmHg.   RECOMMENDATIONS:   Medical management. In Cath Lab patient was essentially pain-free and the culprit vessel is a previously stented 60 year old graft without any chance of meaningful patency.  Therefore intervention was not indicated and could potentially cause harm. Discussed with Dr. Percival Spanish.  _____________   History of Present Illness     Peter Garrison is a 60 y.o. male with hx of CAD and CABG remotely, obesity, HLD, DM-2  who is being seen 04/22/2021 for the evaluation of chest pain.  Peter Garrison pt with hx of CAD and CABG  in past -last nuc ordered but not done for fatigue.  In 05/2019 he had echo with EF 55-60%, mod. Concentric LVH.   He had called with recent chest pain relief with NTG. Now presents to ER with chest pain for 2-3 days and took 2 NTG enroute without relief. -reported to RN he had 7 prior MIs.  Pain began Monday walking in file and felt uneasy in chest with SOB rested and resolved.   He was seen by PCP on Tuesday with sinus issues placed on ABX.  Yesterday even at rest with chest pain and SOB, at times with diaphoresis.  He did have indigestion butt his pain is more his cardiac pain. Feels like his first MI at age 57 or 23.     I find cath from 2018 with [facility] with all native arteries are diffusely diseased and small.  Pt had DES of VG to OM-1 placed. With plan for DAPT for 1 year.  (VG to OM-1, VG to mLAD, LIMA is not a graft.  In 2016 he had DES to VG to LCX. For cath with   Three vessel CAD - LAD nonobstructive; native RCA and LCx occluded  SVG X 2 occluded; SVG LCx - culprit lesion; LIMa nongrafted  Normal LV systolic function.   EKG:  The ECG that was done 04/22/21 was personally reviewed and demonstrates SB with no acute ST changes, follow up EKGs with SB and [place] PVCs and couplets   Na 136, K+ 5.4 glucose 200 Cr 1.11 Hs troponin 9 and 24 WBC 11, Hgb 17.2 plts 273 Neg COVID   PCXR No acute cardiopulmonary abnormality.   BP 174/95 P 60 to 55 afebrile BP up to 157/91 Placed on IV heparin. Still with chest pain after 4 mg IV Morphine, IV NTG started.   Hospital Course     Consultants: N/A   Patient was admitted to [facility] on 04/22/2021 with chest pain and mild elevation of troponin.  Cardiac catheterization performed on the same day revealed a total occlusion of SVG to OM/left circumflex, CTO of SVG to LAD, CTO of SVG to RCA, totally occluded proximal to mid RCA, left main widely patent, patent LAD with multiple proximal to distal 50% narrowing but no critical lesion.  The culprit lesion was felt to be due to subacute occlusion of SVG to OM/left circumflex.  Since occlusion likely occurred 24 to 36 hours prior to cardiac catheterization, it was recommended to proceed with medical therapy.  With the burden of thrombus present in the graft, intervention was not recommended.  Postprocedure, he was restarted on home aspirin and Brilinta.  Imdur was added for antianginal purposes.  Echocardiogram obtained on 04/23/2021 showed EF 60 to 65%, grade 2 DD, mild to moderate aortic valve sclerosis without evidence of aortic stenosis.  When compared to the previous TTE in 2020, there is no significant change.  He was seen in the morning of 04/24/2021 at which time he had no chest pain with ambulation.  He is still deemed stable for discharge from cardiac perspective.  He was discharged on 120 mg daily of Imdur.  During this admission, he received 3 days of  amoxicillin at 500 mg 3 times daily dosing.  I discussed with our clinical pharmacist who recommended a total of 10-day course of antibiotic for acute sinusitis.   Did the patient have an acute coronary syndrome (MI, NSTEMI, STEMI, etc) this admission?:  Yes                               AHA/ACC Clinical Performance & Quality Measures: Aspirin prescribed? - Yes ADP Receptor Inhibitor (Plavix/Clopidogrel, Brilinta/Ticagrelor or Effient/Prasugrel) prescribed (includes medically managed patients)? - Yes Beta Blocker prescribed? - Yes High Intensity Statin (Lipitor 40-80mg  or Crestor 20-40mg ) prescribed? - Yes EF assessed during THIS hospitalization? - Yes For EF <40%, was ACEI/ARB prescribed? - Not Applicable (EF >/= 16%) For EF <40%, Aldosterone Antagonist (Spironolactone or Eplerenone) prescribed? - Not Applicable (EF >/= 10%) Cardiac Rehab Phase II ordered (including medically managed patients)? - Yes  _____________  Discharge Vitals Blood pressure 116/81, pulse 89, temperature 98.6 F (37 C), temperature source Oral, resp. rate 18, height 5\' 7"  (1.702 m), weight 119.3 kg, SpO2 98 %.  Filed Weights   04/22/21 1026  Weight: 119.3 kg    Labs & Radiologic Studies    CBC Recent Labs    04/23/21 0604 04/24/21 0227  WBC 13.4* 12.5*  HGB 15.1 15.5  HCT 44.9 44.3  MCV 87.2 85.7  PLT 229 431   Basic Metabolic Panel Recent Labs    04/22/21 1332 04/23/21 0604  NA 137 132*  K 4.4 4.2  CL 105 101  CO2 23 22  GLUCOSE 180* 221*  BUN 14 12  CREATININE 1.06 1.06  CALCIUM 9.6 9.2  MG 1.9  --    Liver Function Tests Recent Labs    04/22/21 1332  AST 31  ALT 25  ALKPHOS 62  BILITOT 0.8  PROT 7.1  ALBUMIN 3.8   No results for input(s): LIPASE, AMYLASE in the last 72 hours. High Sensitivity Troponin:   Recent Labs  Lab 04/22/21 1040 04/22/21 1223 04/22/21 1844 04/23/21 1046  TROPONINIHS 9 24* 8,086* 17,949*    BNP Invalid input(s): POCBNP D-Dimer No results  for input(s): DDIMER in the last 72 hours. Hemoglobin A1C Recent Labs    04/22/21 1444  HGBA1C 7.2*   Fasting Lipid Panel Recent Labs    04/23/21 0604  CHOL 126  HDL 30*  LDLCALC 64  TRIG 161*  CHOLHDL 4.2   Thyroid Function Tests Recent Labs    04/22/21 1333  TSH 2.795   _____________  CARDIAC CATHETERIZATION  Result Date: 04/22/2021 Recent thrombotic occlusion of saphenous vein graft to the circumflex/marginal system.  Culprit vessel for presentation.  At the time of presentation no chest pain, no acute EKG changes, and presumed occlusion within the past 36 hours. 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 Total occlusion of native RCA Severe diffuse disease in native circumflex including total occlusion of the distalmost obtuse marginal and severe diffuse disease and a moderate sized distal obtuse marginal with evidence of prior stenting with total occlusion also noted. Widely patent left main Moderately severe diffuse atherosclerosis from proximal to distal LAD without high-grade obstructive disease. LVEDP is normal at 8 mmHg. RECOMMENDATIONS: Medical management. In Cath Lab patient was essentially pain-free and the culprit vessel is a previously stented 60 year old graft without any chance of meaningful patency.  Therefore intervention was not indicated and could potentially cause harm. Discussed with Dr. Percival Spanish.  DG Chest Port 1 View  Result Date: 04/22/2021 CLINICAL DATA:  60 year old male with chest pain for 2-3 days. Nitroglycerin without relief. EXAM: PORTABLE CHEST 1 VIEW COMPARISON:  Portable chest 03/01/2015 and earlier. FINDINGS: Portable AP upright view at 1055 hours. Prior CABG. Lung volumes and mediastinal contours are stable and within normal limits. Visualized tracheal air column is within normal limits. Allowing for portable technique the lungs are clear. No pneumothorax or pleural effusion. No acute  osseous abnormality identified. IMPRESSION: No acute cardiopulmonary abnormality. Electronically Signed   By: Genevie Ann M.D.   On: 04/22/2021 11:21   ECHOCARDIOGRAM COMPLETE  Result Date: 04/23/2021    ECHOCARDIOGRAM REPORT   Patient Name:   Peter Garrison Date of Exam: 04/23/2021 Medical Rec #:  [id number]    Height:       67.0 in Accession #:    [id number]   Weight:  263.0 lb Date of Birth:  February 06, 1961    BSA:          2.272 m Patient Age:    76 years     BP:           139/78 mmHg Patient Gender: M            HR:           67 bpm. Exam Location:  Inpatient Procedure: 2D Echo, Cardiac Doppler, Color Doppler and Intracardiac            Opacification Agent Indications:    chest pain  History:        Patient has prior history of Echocardiogram examinations, most                 recent 05/15/2019. CAD, Prior CABG; Risk Factors:Diabetes, Sleep                 Apnea and Dyslipidemia.  Sonographer:    Luisa Hart RDCS Referring Phys: [place]  1. Left ventricular ejection fraction, by estimation, is 60 to 65%. The left ventricle has normal function. The left ventricle has no regional wall motion abnormalities. There is moderate hypertrophy of the basal-septum. The rest of the LV segments demonstrate mild left ventricular hypertrophy. Left ventricular diastolic parameters are consistent with Grade II diastolic dysfunction (pseudonormalization). Elevated left atrial pressure.  2. Right ventricular systolic function is normal. The right ventricular size is normal.  3. The mitral valve is normal in structure. No evidence of mitral valve regurgitation.  4. The aortic valve is tricuspid. There is mild calcification of the aortic valve. There is mild thickening of the aortic valve. Aortic valve regurgitation is not visualized. Mild to moderate aortic valve sclerosis/calcification is present, without any evidence of aortic stenosis. Comparison(s): Compared to prior TTE in 2020, there is no significant  change. FINDINGS  Left Ventricle: Left ventricular ejection fraction, by estimation, is 60 to 65%. The left ventricle has normal function. The left ventricle has no regional wall motion abnormalities. Definity contrast agent was given IV to delineate the left ventricular  endocardial borders. The left ventricular internal cavity size was normal in size. There is moderate hypertrophy of the basal-septum. The rest of the LV segments demonstrate mild left ventricular hypertrophy. Left ventricular diastolic parameters are consistent with Grade II diastolic dysfunction (pseudonormalization). Elevated left atrial pressure. Right Ventricle: The right ventricular size is normal. Right vetricular wall thickness was not well visualized. Right ventricular systolic function is normal. Left Atrium: Left atrial size was normal in size. Right Atrium: Right atrial size was normal in size. Pericardium: There is no evidence of pericardial effusion. Mitral Valve: The mitral valve is normal in structure. No evidence of mitral valve regurgitation. Tricuspid Valve: The tricuspid valve is normal in structure. Tricuspid valve regurgitation is trivial. Aortic Valve: The aortic valve is tricuspid. There is mild calcification of the aortic valve. There is mild thickening of the aortic valve. Aortic valve regurgitation is not visualized. Mild to moderate aortic valve sclerosis/calcification is present, without any evidence of aortic stenosis. Aortic valve mean gradient measures 9.0 mmHg. Aortic valve peak gradient measures 16.6 mmHg. Aortic valve area, by VTI measures 2.20 cm. Pulmonic Valve: The pulmonic valve was not well visualized. Pulmonic valve regurgitation is not visualized. Aorta: The aortic root and ascending aorta are structurally normal, with no evidence of dilitation. Venous: The inferior vena cava was not well visualized. IAS/Shunts: No atrial level shunt detected  by color flow Doppler.  LEFT VENTRICLE PLAX 2D LVIDd:          5.60 cm  Diastology LVIDs:         3.70 cm  LV e' medial:    4.24 cm/s LV PW:         1.00 cm  LV E/e' medial:  25.7 LV IVS:        1.00 cm  LV e' lateral:   6.74 cm/s LVOT diam:     2.00 cm  LV E/e' lateral: 16.2 LV SV:         85 LV SV Index:   37 LVOT Area:     3.14 cm  RIGHT VENTRICLE RV Basal diam:  4.70 cm     PULMONARY VEINS RV Mid diam:    2.00 cm     A Reversal Duration: 100.00 msec RV S prime:     16.80 cm/s  A Reversal Velocity: 24.60 cm/s TAPSE (M-mode): 2.1 cm      Diastolic Velocity:  40.97 cm/s                             S/D Velocity:        1.20                             Systolic Velocity:   35.32 cm/s LEFT ATRIUM           Index       RIGHT ATRIUM           Index LA diam:      3.50 cm 1.54 cm/m  RA Area:     14.10 cm LA Vol (A4C): 53.0 ml 23.33 ml/m RA Volume:   36.50 ml  16.07 ml/m  AORTIC VALVE                    PULMONIC VALVE AV Area (Vmax):    2.05 cm     PV Vmax:       1.23 m/s AV Area (Vmean):   2.34 cm     PV Vmean:      83.950 cm/s AV Area (VTI):     2.20 cm     PV VTI:        0.232 m AV Vmax:           204.00 cm/s  PV Peak grad:  6.1 mmHg AV Vmean:          134.000 cm/s PV Mean grad:  3.5 mmHg AV VTI:            0.385 m AV Peak Grad:      16.6 mmHg AV Mean Grad:      9.0 mmHg LVOT Vmax:         133.00 cm/s LVOT Vmean:        99.900 cm/s LVOT VTI:          0.269 m LVOT/AV VTI ratio: 0.70  AORTA Ao Root diam: 3.20 cm Ao Asc diam:  3.30 cm MITRAL VALVE                TRICUSPID VALVE MV Area (PHT): 3.77 cm     TR Peak grad:   36.2 mmHg MV Decel Time: 201 msec     TR Vmax:        301.00 cm/s MV E velocity: 109.00 cm/s MV A velocity:  105.00 cm/s  SHUNTS MV E/A ratio:  1.04         Systemic VTI:  0.27 m                             Systemic Diam: 2.00 cm Gwyndolyn Kaufman MD Electronically signed by Gwyndolyn Kaufman MD Signature Date/Time: 04/23/2021/4:12:56 PM    Final    Disposition   Pt is being discharged home today in good condition.  Follow-up Plans & Appointments      Follow-up Information     Park Liter, MD Follow up on 04/26/2021.   Specialty: Cardiology Why: 10:20AM. Cardiology visit. Contact information: [place] [address] [phone]                Discharge Instructions     Diet - low sodium heart healthy   Complete by: As directed    Discharge instructions   Complete by: As directed    No driving for 24 hours. No lifting over 5 lbs for 1 week. No sexual activity for 1 week.  Keep procedure site clean & dry. If you notice increased pain, swelling, bleeding or pus, call/return!  You may shower, but no soaking baths/hot tubs/pools for 1 week.   Increase activity slowly   Complete by: As directed        Discharge Medications   Allergies as of 04/24/2021       Reactions   Adhesive [tape]    Percocet [oxycodone-acetaminophen]    Prednisone Other (See Comments)   Vision changes        Medication List     STOP taking these medications    amoxicillin-clavulanate 875-125 MG tablet Commonly known as: AUGMENTIN   metoprolol tartrate 25 MG tablet Commonly known as: LOPRESSOR       TAKE these medications    amoxicillin 500 MG capsule Commonly known as: AMOXIL Take 1 capsule (500 mg total) by mouth every 8 (eight) hours.   aspirin EC 81 MG tablet Take 81 mg by mouth every evening. Swallow whole.   busPIRone 15 MG tablet Commonly known as: BUSPAR Take 15 mg by mouth 2 (two) times daily.   carvedilol 6.25 MG tablet Commonly known as: COREG Take 1 tablet (6.25 mg total) by mouth 2 (two) times daily with a meal.   Cholecalciferol 50 MCG (2000 UT) Tabs Take 1 tablet by mouth daily.   famotidine 20 MG tablet Commonly known as: PEPCID Take 20 mg by mouth every evening.   fenofibrate 54 MG tablet Take 1 tablet (54 mg total) by mouth daily. Start taking on: April 25, 2021 What changed:  medication strength how much to take   glipiZIDE 10 MG tablet Commonly known as: GLUCOTROL Take 10  mg by mouth daily.   HYDROcodone-acetaminophen 10-325 MG tablet Commonly known as: NORCO Take 1 tablet by mouth 4 (four) times daily as needed for pain.   isosorbide mononitrate 120 MG 24 hr tablet Commonly known as: IMDUR Take 1 tablet (120 mg total) by mouth daily. Start taking on: April 25, 2021   Krill Oil 1000 MG Caps Take 1,000 mg by mouth every evening.   lisinopril 5 MG tablet Commonly known as: ZESTRIL Take 5 mg by mouth daily.   loratadine 10 MG tablet Commonly known as: CLARITIN Take 10 mg by mouth every evening.   nitroGLYCERIN 0.4 MG SL tablet Commonly known as: NITROSTAT Place 1 tablet (0.4 mg total) under  the tongue as needed for chest pain.   Ozempic (1 MG/DOSE) 4 MG/3ML Sopn Generic drug: Semaglutide (1 MG/DOSE) Inject 1 mg into the skin once a week. Mondays   pantoprazole 40 MG tablet Commonly known as: PROTONIX Take 40 mg by mouth every evening.   rosuvastatin 20 MG tablet Commonly known as: CRESTOR Take 20 mg by mouth every evening.   ticagrelor 90 MG Tabs tablet Commonly known as: BRILINTA Take 90 mg by mouth 2 (two) times daily.   VITAMIN C PO Take 1 tablet by mouth daily.   zinc gluconate 50 MG tablet Take 1 tablet by mouth daily.           Outstanding Labs/Studies   N/A  Duration of Discharge Encounter   Greater than 30 minutes including physician time.  Hilbert Corrigan, PA 04/24/2021, 11:20 AM

## 2021-04-26 ENCOUNTER — Ambulatory Visit (INDEPENDENT_AMBULATORY_CARE_PROVIDER_SITE_OTHER): Payer: Medicare Other | Admitting: Cardiology

## 2021-04-26 ENCOUNTER — Encounter: Payer: Self-pay | Admitting: Cardiology

## 2021-04-26 ENCOUNTER — Other Ambulatory Visit: Payer: Self-pay

## 2021-04-26 ENCOUNTER — Telehealth: Payer: Self-pay | Admitting: Cardiology

## 2021-04-26 VITALS — BP 122/78 | HR 69 | Ht 67.0 in | Wt 266.0 lb

## 2021-04-26 DIAGNOSIS — I259 Chronic ischemic heart disease, unspecified: Secondary | ICD-10-CM | POA: Diagnosis not present

## 2021-04-26 DIAGNOSIS — Z951 Presence of aortocoronary bypass graft: Secondary | ICD-10-CM | POA: Diagnosis not present

## 2021-04-26 DIAGNOSIS — I10 Essential (primary) hypertension: Secondary | ICD-10-CM | POA: Diagnosis not present

## 2021-04-26 DIAGNOSIS — F419 Anxiety disorder, unspecified: Secondary | ICD-10-CM | POA: Insufficient documentation

## 2021-04-26 DIAGNOSIS — G4733 Obstructive sleep apnea (adult) (pediatric): Secondary | ICD-10-CM

## 2021-04-26 DIAGNOSIS — G894 Chronic pain syndrome: Secondary | ICD-10-CM | POA: Insufficient documentation

## 2021-04-26 DIAGNOSIS — F17290 Nicotine dependence, other tobacco product, uncomplicated: Secondary | ICD-10-CM | POA: Insufficient documentation

## 2021-04-26 MED ORDER — RANOLAZINE ER 500 MG PO TB12: 500.0000 mg | ORAL_TABLET | Freq: Two times a day (BID) | ORAL | 3 refills | Status: AC

## 2021-04-26 NOTE — Patient Instructions (Signed)
Medication Instructions:  Your physician has recommended you make the following change in your medication:  START: Ranexa 500 mg twice daily *If you need a refill on your cardiac medications before your next appointment, please call your pharmacy*   Lab Work: None If you have labs (blood work) drawn today and your tests are completely normal, you will receive your results only by: [place] (if you have MyChart) OR A paper copy in the mail If you have any lab test that is abnormal or we need to change your treatment, we will call you to review the results.   Testing/Procedures: None   Follow-Up: At [facility], you and your health needs are our priority.  As part of our continuing mission to provide you with exceptional heart care, we have created designated Provider Care Teams.  These Care Teams include your primary Cardiologist (physician) and Advanced Practice Providers (APPs -  Physician Assistants and Nurse Practitioners) who all work together to provide you with the care you need, when you need it.  We recommend signing up for the patient portal called "MyChart".  Sign up information is provided on this After Visit Summary.  MyChart is used to connect with patients for Virtual Visits (Telemedicine).  Patients are able to view lab/test results, encounter notes, upcoming appointments, etc.  Non-urgent messages can be sent to your provider as well.   To learn more about what you can do with MyChart, go to [email or web address].    Your next appointment:   2 month(s)  The format for your next appointment:   In Person  Provider:   Jenne Campus, MD   Other Instructions

## 2021-04-26 NOTE — Telephone Encounter (Signed)
Pt c/o medication issue:  1. Name of Medication: ranolazine (RANEXA) 500 MG 12 hr tablet  2. How are you currently taking this medication (dosage and times per day)? Not currently taking the medication  3. Are you having a reaction (difficulty breathing--STAT)? No  4. What is your medication issue? Pt's calling in with concerns about this medication.pt states that [facility] is saying that the medication was filled back in June

## 2021-04-26 NOTE — Telephone Encounter (Signed)
I called and spoke to the pharmacy directly in regards to this. The pharmacy tells me that the patient picked up Ranexa 500 mg twice daily 90 day supply on 04/06/2021 that was prescribed by the patients PCP Dr. Zigmund Daniel.   When speaking with the wife she states that she probably picked up this medication and just threw it in the trash since she did not know anything about it. The pharamcy is now refusing to fill the medication since he already picked up a 90 day supply just a couple of weeks ago.   The wife would like for the prescription to be called in to a different pharmacy.   I will route this over to Dr. Agustin Cree to advise as I feel uncomfortable with this situation. I will let him decide if we should call this in to a different pharmacy or not.

## 2021-04-26 NOTE — Progress Notes (Signed)
Cardiology Office Note:    Date:  04/26/2021   ID:  [name], DOB 19-Feb-1961, MRN [id number]  PCP:  Dellia Beckwith, PA-C  Cardiologist:  Jenne Campus, MD    Referring MD: Dellia Beckwith, *   Chief Complaint  Patient presents with   Heart Attack on 04/22/21    History of Present Illness:    Peter Garrison is a 60 y.o. male with past medical history significant for coronary artery disease, he did have coronary artery bypass graft done in 2010 with SVG to LAD, SVG to RCA SVG to obtuse marginal branch.  He also got morbid obesity essential hypertension dyslipidemia.  2 weeks ago he presented to the hospital with chest pain.  In the non-STEMI.  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  It was felt to be relatively old meaning more than 36 hours.  He was completely asymptomatic at the time of intervention, and that is why decision of medical therapy has been made.  He was put on higher dose of nitroglycerin he comes today 2 to my office for follow-up. He was discharged from hospital just few days ago.  On Saturday he described 1 episode of pain in the chest that required 2 nitroglycerin.  Otherwise he seems to be doing well.  Denies have any dizziness passing out swelling of lower extremities shortness of breath.  Past Medical History:  Diagnosis Date   Cancer ([place])    Diabetes mellitus without complication ([place])    Hypertension    MI (myocardial infarction) ([place])     Past Surgical History:  Procedure Laterality Date   APPENDECTOMY     CORONARY ARTERY BYPASS GRAFT     KIDNEY SURGERY     KNEE SURGERY     LEFT HEART CATH AND CORS/GRAFTS ANGIOGRAPHY N/A 04/22/2021   Procedure: LEFT HEART CATH AND CORS/GRAFTS ANGIOGRAPHY;  Surgeon: Belva Crome, MD;  Location: [facility];  Service: Cardiovascular;  Laterality: N/A;    Current Medications: Current  Meds  Medication Sig   Ascorbic Acid (VITAMIN C PO) Take 1 tablet by mouth daily. Unknown strenght   aspirin EC 81 MG tablet Take 81 mg by mouth every evening. Swallow whole.   busPIRone (BUSPAR) 15 MG tablet Take 15 mg by mouth 2 (two) times daily.   carvedilol (COREG) 6.25 MG tablet Take 1 tablet (6.25 mg total) by mouth 2 (two) times daily with a meal.   Cholecalciferol 50 MCG (2000 UT) TABS Take 1 tablet by mouth daily.   famotidine (PEPCID) 20 MG tablet Take 20 mg by mouth every evening.   fenofibrate 54 MG tablet Take 1 tablet (54 mg total) by mouth daily.   glipiZIDE (GLUCOTROL) 10 MG tablet Take 10 mg by mouth daily.   HYDROcodone-acetaminophen (NORCO) 10-325 MG tablet Take 1 tablet by mouth 4 (four) times daily as needed for pain.   isosorbide mononitrate (IMDUR) 120 MG 24 hr tablet Take 1 tablet (120 mg total) by mouth daily.   Krill Oil 1000 MG CAPS Take 1,000 mg by mouth every evening.   lisinopril (PRINIVIL,ZESTRIL) 5 MG tablet Take 5 mg by mouth daily.   loratadine (CLARITIN) 10 MG tablet Take 10 mg by mouth every evening.   nitroGLYCERIN (NITROSTAT) 0.4 MG SL tablet Place 1 tablet (0.4 mg total) under the tongue as needed for chest pain.   [place], 1  MG/DOSE, 4 MG/3ML SOPN Inject 1 mg into the skin once a week. Mondays   pantoprazole (PROTONIX) 40 MG tablet Take 40 mg by mouth every evening.   rosuvastatin (CRESTOR) 20 MG tablet Take 20 mg by mouth every evening.   ticagrelor (BRILINTA) 90 MG TABS tablet Take 90 mg by mouth 2 (two) times daily.   zinc gluconate 50 MG tablet Take 1 tablet by mouth daily.     Allergies:   Adhesive [tape], Percocet [oxycodone-acetaminophen], and Prednisone   Social History   Socioeconomic History   Marital status: Married    Spouse name: Not on file   Number of children: Not on file   Years of education: Not on file   Highest education level: Not on file  Occupational History   Not on file  Tobacco Use   Smoking status: Former    Smokeless tobacco: Current    Types: Snuff  Vaping Use   Vaping Use: Never used  Substance and Sexual Activity   Alcohol use: Not Currently   Drug use: Never   Sexual activity: Not on file  Other Topics Concern   Not on file  Social History Narrative   Not on file   Social Determinants of Health   Financial Resource Strain: Not on file  Food Insecurity: Not on file  Transportation Needs: Not on file  Physical Activity: Not on file  Stress: Not on file  Social Connections: Not on file     Family History: The patient's family history includes Aneurysm in his mother; Cancer in his father; Diabetes in his mother; Heart attack in his brother and paternal uncle; Heart disease in his brother. ROS:   Please see the history of present illness.    All 14 point review of systems negative except as described per history of present illness  EKGs/Labs/Other Studies Reviewed:      Recent Labs: 04/22/2021: ALT 25; Magnesium 1.9; TSH 2.795 04/23/2021: BUN 12; Creatinine, Ser 1.06; Potassium 4.2; Sodium 132 04/24/2021: Hemoglobin 15.5; Platelets 203  Recent Lipid Panel    Component Value Date/Time   CHOL 126 04/23/2021 0604   TRIG 161 (H) 04/23/2021 0604   HDL 30 (L) 04/23/2021 0604   CHOLHDL 4.2 04/23/2021 0604   VLDL 32 04/23/2021 0604   LDLCALC 64 04/23/2021 0604    Physical Exam:    VS:  BP 122/78 (BP Location: Right Arm, Patient Position: Sitting)   Pulse 69   Ht 5\' 7"  (1.702 m)   Wt 266 lb (120.7 kg)   SpO2 98%   BMI 41.66 kg/m     Wt Readings from Last 3 Encounters:  04/26/21 266 lb (120.7 kg)  04/22/21 263 lb (119.3 kg)  05/08/19 (!) 304 lb (137.9 kg)     GEN:  Well nourished, well developed in no acute distress HEENT: Normal NECK: No JVD; No carotid bruits LYMPHATICS: No lymphadenopathy CARDIAC: RRR, no murmurs, no rubs, no gallops RESPIRATORY:  Clear to auscultation without rales, wheezing or rhonchi  ABDOMEN: Soft, non-tender,  non-distended MUSCULOSKELETAL:  No edema; No deformity  SKIN: Warm and dry LOWER EXTREMITIES: no swelling NEUROLOGIC:  Alert and oriented x 3 PSYCHIATRIC:  Normal affect   ASSESSMENT:    1. S/P CABG (coronary artery bypass graft)   2. Essential (primary) hypertension   3. Chronic ischemic heart disease   4. Obstructive sleep apnea    PLAN:    In order of problems listed above:  Coronary disease status post coronary bypass graft done in  2010.  Now all venous grafts occluded.  He did not have any arterial graft.  He was felt to be best managed with medications.  Still described to have some rare episode of chest pain.  I will ask him to start taking ranolazine 500 mg twice daily.  He has been on dual antiplatelet therapy for years.  He have difficulty tolerating Plavix.  In spite of being on Plavix he end up having some occlusion of the graft therefore he is being on Brilinta which I will continue indefinitely. Essential hypertension: Blood pressure seems to well controlled we will continue present management. Chronic ischemic heart disease now became problematic.  Occluded all grafts.  Medical therapy. Obstructive sleep apnea that being managed by antimedicine team Dyslipidemia: I do not have fasting lipid profile we will make arrangements for the test.   Medication Adjustments/Labs and Tests Ordered: Current medicines are reviewed at length with the patient today.  Concerns regarding medicines are outlined above.  No orders of the defined types were placed in this encounter.  Medication changes: No orders of the defined types were placed in this encounter.   Signed, Park Liter, MD, [facility] 04/26/2021 11:11 AM    [place]

## 2021-04-27 NOTE — Telephone Encounter (Signed)
Spoke to the patient just now and he told me that he found the prescription when he got home. He is all set at this point.    Encouraged patient to call back with any questions or concerns.

## 2021-05-17 ENCOUNTER — Telehealth: Payer: Self-pay | Admitting: Cardiology

## 2021-05-17 NOTE — Telephone Encounter (Signed)
Spoke with the patient who reports that he is still having episodes of chest pain. He reports that it usually occurs in the evenings around 7-9pm. He states that during the day he is fine. He has had to take nitroglycerin several times for this. He states nitro relieves his pain.  He describes pain as pressure/discomfort. He denies any associated symptoms. Pain usually worse when he is moving around. He is taking Imdur 120 mg daily and Ranexa 500 mg BID.  Patient also reports that his heart rate has been higher than normal. He states typically it is in the 50-60s but recently been in the 47s and 80s. He does not take his blood pressure at home.  Patient also reports that he tested positive for COVID this morning. He reports sore throat and fatigued. Advised patient to get in touch with his PCP.  Patient advised on ER precautions.  Will route to Dr. Agustin Cree for further recommendations.

## 2021-05-17 NOTE — Telephone Encounter (Signed)
   Pt c/o medication issue:  1. Name of Medication: carvedilol (COREG) 6.25 MG tablet  2. How are you currently taking this medication (dosage and times per day)? Take 1 tablet (6.25 mg total) by mouth 2 (two) times daily with a meal.  3. Are you having a reaction (difficulty breathing--STAT)?   4. What is your medication issue? Pt and his wife calling, she said she has question about the carvedilol, she also said pt is sick and would like to know if she can give any over the counter meds like mucinex, lastly, she ask if pt get tested covid+ what they need to do

## 2021-05-18 NOTE — Telephone Encounter (Signed)
Spoke to the patient just now and got him scheduled to see Dr. Agustin Cree on 05/31/2021.

## 2021-05-31 ENCOUNTER — Ambulatory Visit (INDEPENDENT_AMBULATORY_CARE_PROVIDER_SITE_OTHER): Payer: Medicare Other | Admitting: Cardiology

## 2021-05-31 ENCOUNTER — Encounter: Payer: Self-pay | Admitting: Cardiology

## 2021-05-31 ENCOUNTER — Other Ambulatory Visit: Payer: Self-pay

## 2021-05-31 VITALS — BP 138/68 | HR 71 | Ht 67.0 in | Wt 273.0 lb

## 2021-05-31 DIAGNOSIS — I259 Chronic ischemic heart disease, unspecified: Secondary | ICD-10-CM | POA: Diagnosis not present

## 2021-05-31 DIAGNOSIS — G4733 Obstructive sleep apnea (adult) (pediatric): Secondary | ICD-10-CM | POA: Diagnosis not present

## 2021-05-31 DIAGNOSIS — I10 Essential (primary) hypertension: Secondary | ICD-10-CM | POA: Diagnosis not present

## 2021-05-31 DIAGNOSIS — Z951 Presence of aortocoronary bypass graft: Secondary | ICD-10-CM

## 2021-05-31 DIAGNOSIS — E1165 Type 2 diabetes mellitus with hyperglycemia: Secondary | ICD-10-CM

## 2021-05-31 MED ORDER — CARVEDILOL 12.5 MG PO TABS: 12.5000 mg | ORAL_TABLET | Freq: Two times a day (BID) | ORAL | 3 refills | Status: DC

## 2021-05-31 NOTE — Patient Instructions (Signed)
Medication Instructions:  Your physician has recommended you make the following change in your medication:  INCREASE CARVEDILOL TO 12.'5MG'$  TWO TIMES DAILY  *If you need a refill on your cardiac medications before your next appointment, please call your pharmacy*   Lab Work: NONE If you have labs (blood work) drawn today and your tests are completely normal, you will receive your results only by: [place] (if you have MyChart) OR A paper copy in the mail If you have any lab test that is abnormal or we need to change your treatment, we will call you to review the results.   Testing/Procedures: NONE   Follow-Up: At [facility], you and your health needs are our priority.  As part of our continuing mission to provide you with exceptional heart care, we have created designated Provider Care Teams.  These Care Teams include your primary Cardiologist (physician) and Advanced Practice Providers (APPs -  Physician Assistants and Nurse Practitioners) who all work together to provide you with the care you need, when you need it.  We recommend signing up for the patient portal called "MyChart".  Sign up information is provided on this After Visit Summary.  MyChart is used to connect with patients for Virtual Visits (Telemedicine).  Patients are able to view lab/test results, encounter notes, upcoming appointments, etc.  Non-urgent messages can be sent to your provider as well.   To learn more about what you can do with MyChart, go to [email or web address].    Your next appointment:   2 month(s)  The format for your next appointment:   In Person  Provider:   Jenne Campus, MD   Other Instructions

## 2021-05-31 NOTE — Progress Notes (Signed)
Cardiology Office Note:    Date:  05/31/2021   ID:  [name], DOB [birth year], MRN [id number]  PCP:  Dellia Beckwith, PA-C  Cardiologist:  Jenne Campus, MD    Referring MD: Dellia Beckwith, *   Chief Complaint  Patient presents with   chest discomfort     History of Present Illness:    Peter Garrison is a 60 y.o. male  with past medical history significant for coronary artery disease, he did have coronary artery bypass graft done in 2010 with SVG to LAD, SVG to RCA SVG to obtuse marginal branch.  He also got morbid obesity essential hypertension dyslipidemia.  2 weeks ago he presented to the hospital with chest pain.  In the non-STEMI.  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  It was felt to be relatively old meaning more than 36 hours.  He was completely asymptomatic at the time of intervention, and that is why decision of medical therapy has been made.  He comes today to my office for follow-up.  Overall he said he was doing quite poorly for last few weeks but within a week things getting better but still described to have some episodes of chest pain interestingly typically happen at evening time all during the night.  During the day he said he is doing great asymptomatic.  I gave him ranolazine the last time but he cannot tell me if there is any difference  Past Medical History:  Diagnosis Date   Cancer ([place])    Diabetes mellitus without complication ([place])    Hypertension    MI (myocardial infarction) ([place])     Past Surgical History:  Procedure Laterality Date   APPENDECTOMY     CORONARY ARTERY BYPASS GRAFT     KIDNEY SURGERY     KNEE SURGERY     LEFT HEART CATH AND CORS/GRAFTS ANGIOGRAPHY N/A 04/22/2021   Procedure: LEFT HEART CATH AND CORS/GRAFTS ANGIOGRAPHY;  Surgeon: Belva Crome, MD;  Location: [facility];  Service: Cardiovascular;   Laterality: N/A;    Current Medications: Current Meds  Medication Sig   Ascorbic Acid (VITAMIN C PO) Take 1 tablet by mouth daily. Unknown strenght   aspirin EC 81 MG tablet Take 81 mg by mouth every evening. Swallow whole.   busPIRone (BUSPAR) 15 MG tablet Take 15 mg by mouth 2 (two) times daily.   carvedilol (COREG) 6.25 MG tablet Take 1 tablet (6.25 mg total) by mouth 2 (two) times daily with a meal.   Cholecalciferol 50 MCG (2000 UT) TABS Take 1 tablet by mouth daily.   famotidine (PEPCID) 20 MG tablet Take 20 mg by mouth every evening.   fenofibrate 54 MG tablet Take 1 tablet (54 mg total) by mouth daily.   glipiZIDE (GLUCOTROL) 10 MG tablet Take 10 mg by mouth daily.   HYDROcodone-acetaminophen (NORCO) 10-325 MG tablet Take 1 tablet by mouth 4 (four) times daily as needed for pain.   isosorbide mononitrate (IMDUR) 120 MG 24 hr tablet Take 1 tablet (120 mg total) by mouth daily.   Krill Oil 1000 MG CAPS Take 1,000 mg by mouth every evening.   lisinopril (PRINIVIL,ZESTRIL) 5 MG tablet Take 5 mg by mouth daily.   loratadine (CLARITIN) 10 MG tablet Take 10 mg by mouth every evening.   nitroGLYCERIN (NITROSTAT) 0.4 MG SL tablet Place 1 tablet (0.4 mg  total) under the tongue as needed for chest pain.   OZEMPIC, 1 MG/DOSE, 4 MG/3ML SOPN Inject 1 mg into the skin once a week. Mondays   pantoprazole (PROTONIX) 40 MG tablet Take 40 mg by mouth every evening.   ranolazine (RANEXA) 500 MG 12 hr tablet Take 1 tablet (500 mg total) by mouth 2 (two) times daily.   rosuvastatin (CRESTOR) 20 MG tablet Take 20 mg by mouth every evening.   ticagrelor (BRILINTA) 90 MG TABS tablet Take 90 mg by mouth 2 (two) times daily.   zinc gluconate 50 MG tablet Take 1 tablet by mouth daily.     Allergies:   Adhesive [tape], Percocet [oxycodone-acetaminophen], and Prednisone   Social History   Socioeconomic History   Marital status: Married    Spouse name: Not on file   Number of children: Not on file    Years of education: Not on file   Highest education level: Not on file  Occupational History   Not on file  Tobacco Use   Smoking status: Former   Smokeless tobacco: Current    Types: Snuff  Vaping Use   Vaping Use: Never used  Substance and Sexual Activity   Alcohol use: Not Currently   Drug use: Never   Sexual activity: Not on file  Other Topics Concern   Not on file  Social History Narrative   Not on file   Social Determinants of Health   Financial Resource Strain: Not on file  Food Insecurity: Not on file  Transportation Needs: Not on file  Physical Activity: Not on file  Stress: Not on file  Social Connections: Not on file     Family History: The patient's family history includes Aneurysm in his mother; Cancer in his father; Diabetes in his mother; Heart attack in his brother and paternal uncle; Heart disease in his brother. ROS:   Please see the history of present illness.    All 14 point review of systems negative except as described per history of present illness  EKGs/Labs/Other Studies Reviewed:      Recent Labs: 04/22/2021: ALT 25; Magnesium 1.9; TSH 2.795 04/23/2021: BUN 12; Creatinine, Ser 1.06; Potassium 4.2; Sodium 132 04/24/2021: Hemoglobin 15.5; Platelets 203  Recent Lipid Panel    Component Value Date/Time   CHOL 126 04/23/2021 0604   TRIG 161 (H) 04/23/2021 0604   HDL 30 (L) 04/23/2021 0604   CHOLHDL 4.2 04/23/2021 0604   VLDL 32 04/23/2021 0604   LDLCALC 64 04/23/2021 0604    Physical Exam:    VS:  BP 138/68 (BP Location: Right Arm, Patient Position: Sitting)   Pulse 71   Ht '5\' 7"'$  (1.702 m)   Wt 273 lb (123.8 kg)   SpO2 96%   BMI 42.76 kg/m     Wt Readings from Last 3 Encounters:  05/31/21 273 lb (123.8 kg)  04/26/21 266 lb (120.7 kg)  04/22/21 263 lb (119.3 kg)     GEN:  Well nourished, well developed in no acute distress HEENT: Normal NECK: No JVD; No carotid bruits LYMPHATICS: No lymphadenopathy CARDIAC: RRR, no murmurs, no  rubs, no gallops RESPIRATORY:  Clear to auscultation without rales, wheezing or rhonchi  ABDOMEN: Soft, non-tender, non-distended MUSCULOSKELETAL:  No edema; No deformity  SKIN: Warm and dry LOWER EXTREMITIES: no swelling NEUROLOGIC:  Alert and oriented x 3 PSYCHIATRIC:  Normal affect   ASSESSMENT:    1. S/P CABG (coronary artery bypass graft)   2. Essential (primary) hypertension   3.  Chronic ischemic heart disease   4. Obstructive sleep apnea   5. Type 2 diabetes mellitus with hyperglycemia, without long-term current use of insulin (HCC)    PLAN:    In order of problems listed above:  Coronary artery disease which is advanced he does have all venous grafts occluded.  Still got some episode of chest pain I think there are some benefits of potentially increasing dose of beta-blocker.  I will ask him to have EKG today if EKG is fine we will increase dose of her beta-blocker carvedilol to 12.5 twice daily.  Also few days later I will ask him to discontinue his ranolazine.  He tells me he cannot see any improvement with it. Essential hypertension: Blood pressure seems to be well controlled continue present management. Obstructive sleep apnea: Followed by antimedicine team. Dyslipidemia I did review K PN showing data from 04/23/2021 with LDL 64 HDL 30 he is on Crestor 20 which I will continue. Type 2 diabetes I do see hemoglobin A1c which is 7.2 I need to be better controlled.   Medication Adjustments/Labs and Tests Ordered: Current medicines are reviewed at length with the patient today.  Concerns regarding medicines are outlined above.  No orders of the defined types were placed in this encounter.  Medication changes: No orders of the defined types were placed in this encounter.   Signed, Park Liter, MD, [facility] 05/31/2021 2:30 PM    [place]

## 2021-07-06 ENCOUNTER — Ambulatory Visit: Payer: Medicare Other | Admitting: Cardiology

## 2021-08-09 DIAGNOSIS — C801 Malignant (primary) neoplasm, unspecified: Secondary | ICD-10-CM | POA: Insufficient documentation

## 2021-08-09 DIAGNOSIS — E119 Type 2 diabetes mellitus without complications: Secondary | ICD-10-CM | POA: Insufficient documentation

## 2021-08-09 DIAGNOSIS — I219 Acute myocardial infarction, unspecified: Secondary | ICD-10-CM | POA: Insufficient documentation

## 2021-08-10 ENCOUNTER — Ambulatory Visit (INDEPENDENT_AMBULATORY_CARE_PROVIDER_SITE_OTHER): Payer: Medicare Other | Admitting: Cardiology

## 2021-08-10 ENCOUNTER — Other Ambulatory Visit: Payer: Self-pay

## 2021-08-10 VITALS — BP 130/72 | HR 69 | Ht 66.0 in | Wt 280.4 lb

## 2021-08-10 DIAGNOSIS — I25118 Atherosclerotic heart disease of native coronary artery with other forms of angina pectoris: Secondary | ICD-10-CM | POA: Diagnosis not present

## 2021-08-10 DIAGNOSIS — I259 Chronic ischemic heart disease, unspecified: Secondary | ICD-10-CM | POA: Diagnosis not present

## 2021-08-10 DIAGNOSIS — Z951 Presence of aortocoronary bypass graft: Secondary | ICD-10-CM | POA: Diagnosis not present

## 2021-08-10 DIAGNOSIS — E782 Mixed hyperlipidemia: Secondary | ICD-10-CM

## 2021-08-10 DIAGNOSIS — I10 Essential (primary) hypertension: Secondary | ICD-10-CM

## 2021-08-10 MED ORDER — AMLODIPINE BESYLATE 5 MG PO TABS: 5.0000 mg | ORAL_TABLET | Freq: Every day | ORAL | 1 refills | Status: DC

## 2021-08-10 NOTE — Addendum Note (Signed)
Addended by: Senaida Ores on: 08/10/2021 03:52 PM   Modules accepted: Orders

## 2021-08-10 NOTE — Progress Notes (Signed)
Cardiology Office Note:    Date:  08/10/2021   ID:  Peter Garrison, DOB [birth year], MRN [id number]  PCP:  Dellia Beckwith, PA-C  Cardiologist:  Jenne Campus, MD    Referring MD: Dellia Beckwith, *   Chief Complaint  Patient presents with   chest pain continues    History of Present Illness:    Peter Garrison is a 60 y.o. male   with past medical history significant for coronary artery disease, he did have coronary artery bypass graft done in 2010 with SVG to LAD, SVG to RCA SVG to obtuse marginal branch.  He also got morbid obesity essential hypertension dyslipidemia.  2 weeks ago he presented to the hospital with chest pain.  In the non-STEMI.  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  It was felt to be relatively old meaning more than 36 hours.  He was completely asymptomatic at the time of intervention, and that is why decision of medical therapy has been made.  He comes today 2 months of follow-up.  Overall he is doing fair he complained of still having symptoms of chest pain interestingly those episodes typically happen at evening time.  He said since I seen him last time he used nitroglycerin maybe 4 times.  Nitroglycerin promptly relieved the pain typical scenario when he got the pain is at evening time he will eat he will sit in the chair and then when he start getting ready for bed that is when it happened he tells me it feels like my Imdur is wearing out and he is probably right  Past Medical History:  Diagnosis Date   Cancer ([place])    Diabetes mellitus without complication ([place])    Hypertension    MI (myocardial infarction) ([place])     Past Surgical History:  Procedure Laterality Date   APPENDECTOMY     CORONARY ARTERY BYPASS GRAFT     KIDNEY SURGERY     KNEE SURGERY     LEFT HEART CATH AND CORS/GRAFTS ANGIOGRAPHY N/A 04/22/2021   Procedure: LEFT  HEART CATH AND CORS/GRAFTS ANGIOGRAPHY;  Surgeon: Belva Crome, MD;  Location: [facility];  Service: Cardiovascular;  Laterality: N/A;    Current Medications: Current Meds  Medication Sig   Ascorbic Acid (VITAMIN C PO) Take 1 tablet by mouth daily. Unknown strenght   aspirin EC 81 MG tablet Take 81 mg by mouth every evening. Swallow whole.   busPIRone (BUSPAR) 15 MG tablet Take 15 mg by mouth 2 (two) times daily.   carvedilol (COREG) 12.5 MG tablet Take 1 tablet (12.5 mg total) by mouth 2 (two) times daily.   Cholecalciferol 50 MCG (2000 UT) TABS Take 1 tablet by mouth daily.   famotidine (PEPCID) 20 MG tablet Take 20 mg by mouth every evening.   fenofibrate 54 MG tablet Take 1 tablet (54 mg total) by mouth daily.   glipiZIDE (GLUCOTROL) 10 MG tablet Take 10 mg by mouth daily.   HYDROcodone-acetaminophen (NORCO) 10-325 MG tablet Take 1 tablet by mouth 4 (four) times daily as needed for pain.   isosorbide mononitrate (IMDUR) 120 MG 24 hr tablet Take 1 tablet (120 mg total) by mouth daily.   Krill Oil 1000 MG CAPS Take 1,000 mg by mouth every evening.   lisinopril (PRINIVIL,ZESTRIL) 5 MG tablet Take 5 mg by mouth daily.   loratadine (CLARITIN) 10  MG tablet Take 10 mg by mouth every evening.   nitroGLYCERIN (NITROSTAT) 0.4 MG SL tablet Place 1 tablet (0.4 mg total) under the tongue as needed for chest pain.   OZEMPIC, 1 MG/DOSE, 4 MG/3ML SOPN Inject 1 mg into the skin once a week. Mondays   pantoprazole (PROTONIX) 40 MG tablet Take 40 mg by mouth every evening.   ranolazine (RANEXA) 500 MG 12 hr tablet Take 1 tablet (500 mg total) by mouth 2 (two) times daily.   rosuvastatin (CRESTOR) 20 MG tablet Take 20 mg by mouth every evening.   ticagrelor (BRILINTA) 90 MG TABS tablet Take 90 mg by mouth 2 (two) times daily.   zinc gluconate 50 MG tablet Take 1 tablet by mouth daily.     Allergies:   Adhesive [tape], Percocet [oxycodone-acetaminophen], and Prednisone   Social History    Socioeconomic History   Marital status: Married    Spouse name: Not on file   Number of children: Not on file   Years of education: Not on file   Highest education level: Not on file  Occupational History   Not on file  Tobacco Use   Smoking status: Former   Smokeless tobacco: Current    Types: Snuff  Vaping Use   Vaping Use: Never used  Substance and Sexual Activity   Alcohol use: Not Currently   Drug use: Never   Sexual activity: Not on file  Other Topics Concern   Not on file  Social History Narrative   Not on file   Social Determinants of Health   Financial Resource Strain: Not on file  Food Insecurity: Not on file  Transportation Needs: Not on file  Physical Activity: Not on file  Stress: Not on file  Social Connections: Not on file     Family History: The patient's family history includes Aneurysm in his mother; Cancer in his father; Diabetes in his mother; Heart attack in his brother and paternal uncle; Heart disease in his brother. ROS:   Please see the history of present illness.    All 14 point review of systems negative except as described per history of present illness  EKGs/Labs/Other Studies Reviewed:      Recent Labs: 04/22/2021: ALT 25; Magnesium 1.9; TSH 2.795 04/23/2021: BUN 12; Creatinine, Ser 1.06; Potassium 4.2; Sodium 132 04/24/2021: Hemoglobin 15.5; Platelets 203  Recent Lipid Panel    Component Value Date/Time   CHOL 126 04/23/2021 0604   TRIG 161 (H) 04/23/2021 0604   HDL 30 (L) 04/23/2021 0604   CHOLHDL 4.2 04/23/2021 0604   VLDL 32 04/23/2021 0604   LDLCALC 64 04/23/2021 0604    Physical Exam:    VS:  BP 130/72 (BP Location: Right Arm, Patient Position: Sitting)   Pulse 69   Ht 5\' 6"  (1.676 m)   Wt 280 lb 6.4 oz (127.2 kg)   SpO2 92%   BMI 45.26 kg/m     Wt Readings from Last 3 Encounters:  08/10/21 280 lb 6.4 oz (127.2 kg)  05/31/21 273 lb (123.8 kg)  04/26/21 266 lb (120.7 kg)     GEN:  Well nourished, well  developed in no acute distress HEENT: Normal NECK: No JVD; No carotid bruits LYMPHATICS: No lymphadenopathy CARDIAC: RRR, no murmurs, no rubs, no gallops RESPIRATORY:  Clear to auscultation without rales, wheezing or rhonchi  ABDOMEN: Soft, non-tender, non-distended MUSCULOSKELETAL:  No edema; No deformity  SKIN: Warm and dry LOWER EXTREMITIES: no swelling NEUROLOGIC:  Alert and oriented x 3  PSYCHIATRIC:  Normal affect   ASSESSMENT:    1. Coronary artery disease of native artery of native heart with stable angina pectoris ([place])   2. Essential (primary) hypertension   3. Chronic ischemic heart disease   4. S/P CABG (coronary artery bypass graft)   5. Mixed hyperlipidemia    PLAN:    In order of problems listed above:  Coronary disease does have stable angina pectoris.  I am trying to maximize his medical therapy he is already on beta-blocker maximum that he can tolerate, high dose of Imdur, nitroglycerin as needed, ranolazine, I will add calcium channel blocker in form of Norvasc 5 mg daily to his medical regimen.  I asked him to start taking that medication at evening time or Friday afternoon and see if it helps Dyslipidemia I did review his K PN which show me his LDL of 63 HDL 30.  He is on high intense statin form of Crestor 20 which I will continue. History of coronary artery bypass graft.  Noted. Essential hypertension blood pressure well controlled continue present management   Medication Adjustments/Labs and Tests Ordered: Current medicines are reviewed at length with the patient today.  Concerns regarding medicines are outlined above.  No orders of the defined types were placed in this encounter.  Medication changes: No orders of the defined types were placed in this encounter.   Signed, Park Liter, MD, [facility] 08/10/2021 3:43 PM    [place]

## 2021-08-10 NOTE — Patient Instructions (Signed)
Medication Instructions:  Your physician has recommended you make the following change in your medication:  START: Amlodipine 5 mg daily *If you need a refill on your cardiac medications before your next appointment, please call your pharmacy*   Lab Work: None. If you have labs (blood work) drawn today and your tests are completely normal, you will receive your results only by: [place] (if you have MyChart) OR A paper copy in the mail If you have any lab test that is abnormal or we need to change your treatment, we will call you to review the results.   Testing/Procedures: None   Follow-Up: At [facility], you and your health needs are our priority.  As part of our continuing mission to provide you with exceptional heart care, we have created designated Provider Care Teams.  These Care Teams include your primary Cardiologist (physician) and Advanced Practice Providers (APPs -  Physician Assistants and Nurse Practitioners) who all work together to provide you with the care you need, when you need it.  We recommend signing up for the patient portal called "MyChart".  Sign up information is provided on this After Visit Summary.  MyChart is used to connect with patients for Virtual Visits (Telemedicine).  Patients are able to view lab/test results, encounter notes, upcoming appointments, etc.  Non-urgent messages can be sent to your provider as well.   To learn more about what you can do with MyChart, go to [email or web address].    Your next appointment:   3 month(s)  The format for your next appointment:   In Person  Provider:   Jenne Campus, MD   Other Instructions  Amlodipine Tablets What is this medication? AMLODIPINE (am LOE di peen) treats high blood pressure and prevents chest pain (angina). It works by relaxing the blood vessels, which helps decrease the amount of work your heart has to do. It belongs to a group of medications called calcium channel  blockers. This medicine may be used for other purposes; ask your health care provider or pharmacist if you have questions. COMMON BRAND NAME(S): Norvasc What should I tell my care team before I take this medication? They need to know if you have any of these conditions: Heart disease Liver disease An unusual or allergic reaction to amlodipine, other medications, foods, dyes, or preservatives Pregnant or trying to get pregnant Breast-feeding How should I use this medication? Take this medication by mouth. Take it as directed on the prescription label at the same time every day. You can take it with or without food. If it upsets your stomach, take it with food. Keep taking it unless your care team tells you to stop. Talk to your care team about the use of this medication in children. While it may be prescribed for children as young as 6 for selected conditions, precautions do apply. Overdosage: If you think you have taken too much of this medicine contact a poison control center or emergency room at once. NOTE: This medicine is only for you. Do not share this medicine with others. What if I miss a dose? If you miss a dose, take it as soon as you can. If it is almost time for your next dose, take only that dose. Do not take double or extra doses. What may interact with this medication? Clarithromycin Cyclosporine Diltiazem Itraconazole Simvastatin Tacrolimus This list may not describe all possible interactions. Give your health care provider a list of all the medicines, herbs, non-prescription drugs, or dietary supplements you use.  Also tell them if you smoke, drink alcohol, or use illegal drugs. Some items may interact with your medicine. What should I watch for while using this medication? Visit your health care provider for regular checks on your progress. Check your blood pressure as directed. Ask your health care provider what your blood pressure should be. Also, find out when you should  contact him or her. Do not treat yourself for coughs, colds, or pain while you are using this medication without asking your health care provider for advice. Some medications may increase your blood pressure. You may get drowsy or dizzy. Do not drive, use machinery, or do anything that needs mental alertness until you know how this medication affects you. Do not stand up or sit up quickly, especially if you are an older patient. This reduces the risk of dizzy or fainting spells. Alcohol can make you more drowsy and dizzy. Avoid alcoholic drinks. What side effects may I notice from receiving this medication? Side effects that you should report to your care team as soon as possible: Allergic reactions-skin rash, itching, hives, swelling of the face, lips, tongue, or throat Heart attack-pain or tightness in the chest, shoulders, arms, or jaw, nausea, shortness of breath, cold or clammy skin, feeling faint or lightheaded Low blood pressure-dizziness, feeling faint or lightheaded, blurry vision Side effects that usually do not require medical attention (report these to your care team if they continue or are bothersome): Facial flushing, redness Heart palpitations-rapid, pounding, or irregular heartbeat Nausea Stomach pain Swelling of the ankles, hands, or feet This list may not describe all possible side effects. Call your doctor for medical advice about side effects. You may report side effects to FDA at 1-800-FDA-1088. Where should I keep my medication? Keep out of the reach of children and pets. Store at room temperature between 20 and 25 degrees C (68 and 77 degrees F). Protect from light and moisture. Keep the container tightly closed. Get rid of any unused medication after the expiration date. To get rid of medications that are no longer needed or have expired: Take the medication to a medication take-back program. Check with your pharmacy or law enforcement to find a location. If you cannot  return the medication, check the label or package insert to see if the medication should be thrown out in the garbage or flushed down the toilet. If you are not sure, ask your health care provider. If it is safe to put in the trash, empty the medication out of the container. Mix the medication with cat litter, dirt, coffee grounds, or other unwanted substance. Seal the mixture in a bag or container. Put it in the trash. NOTE: This sheet is a summary. It may not cover all possible information. If you have questions about this medicine, talk to your doctor, pharmacist, or health care provider.  2022 Elsevier/Gold Standard (2020-08-22 14:59:47)

## 2021-09-30 ENCOUNTER — Other Ambulatory Visit: Payer: Self-pay | Admitting: Cardiology

## 2021-10-18 ENCOUNTER — Other Ambulatory Visit: Payer: Self-pay | Admitting: Physician Assistant

## 2021-10-19 DIAGNOSIS — F1190 Opioid use, unspecified, uncomplicated: Secondary | ICD-10-CM | POA: Insufficient documentation

## 2021-11-16 DIAGNOSIS — Z0289 Encounter for other administrative examinations: Secondary | ICD-10-CM | POA: Insufficient documentation

## 2021-11-16 DIAGNOSIS — M5136 Other intervertebral disc degeneration, lumbar region: Secondary | ICD-10-CM | POA: Insufficient documentation

## 2021-11-18 ENCOUNTER — Encounter: Payer: Self-pay | Admitting: Cardiology

## 2021-11-18 ENCOUNTER — Ambulatory Visit (INDEPENDENT_AMBULATORY_CARE_PROVIDER_SITE_OTHER): Payer: Medicare Other | Admitting: Cardiology

## 2021-11-18 ENCOUNTER — Other Ambulatory Visit: Payer: Self-pay

## 2021-11-18 VITALS — BP 130/70 | HR 66 | Ht 67.0 in | Wt 286.2 lb

## 2021-11-18 DIAGNOSIS — I25118 Atherosclerotic heart disease of native coronary artery with other forms of angina pectoris: Secondary | ICD-10-CM

## 2021-11-18 DIAGNOSIS — E119 Type 2 diabetes mellitus without complications: Secondary | ICD-10-CM

## 2021-11-18 DIAGNOSIS — I10 Essential (primary) hypertension: Secondary | ICD-10-CM | POA: Diagnosis not present

## 2021-11-18 DIAGNOSIS — Z951 Presence of aortocoronary bypass graft: Secondary | ICD-10-CM | POA: Diagnosis not present

## 2021-11-18 NOTE — Progress Notes (Signed)
Cardiology Office Note:    Date:  11/18/2021   ID:  Peter Garrison, DOB 09-09-1961, MRN [id number]  PCP:  Dellia Beckwith, PA-C  Cardiologist:  Jenne Campus, MD    Referring MD: Dellia Beckwith, *   Chief Complaint  Patient presents with   Medication Refill    All cardiac meds    History of Present Illness:    Peter Garrison is a 61 y.o. male    with past medical history significant for coronary artery disease, he did have coronary artery bypass graft done in 2010 with SVG to LAD, SVG to RCA SVG to obtuse marginal branch.  He also got morbid obesity essential hypertension dyslipidemia.  Few months ago ago he presented to the hospital with chest pain.  I he was diagnosed with non-STEMI.  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  It was felt to be relatively old meaning more than 36 hours.  He was completely asymptomatic at the time of intervention, and that is why decision of medical therapy has been made.  He was still having episode of angina pectoris after cardiac catheterization last time I seen him maximize his medical therapy.  He is probably on all antianginal group medications last time I put him on concentrated blocker in form of amlodipine.  He is into my office today and he said he is feeling better.  He had very rare episodes when driving and when he really pushed himself hard otherwise doing well  Past Medical History:  Diagnosis Date   Cancer ([place])    Diabetes mellitus without complication ([place])    Hypertension    MI (myocardial infarction) ([place])     Past Surgical History:  Procedure Laterality Date   APPENDECTOMY     CORONARY ARTERY BYPASS GRAFT     KIDNEY SURGERY     KNEE SURGERY     LEFT HEART CATH AND CORS/GRAFTS ANGIOGRAPHY N/A 04/22/2021   Procedure: LEFT HEART CATH AND CORS/GRAFTS ANGIOGRAPHY;  Surgeon: Belva Crome, MD;  Location: [facility];  Service: Cardiovascular;  Laterality: N/A;    Current Medications: Current Meds  Medication Sig   amLODipine (NORVASC) 5 MG tablet Take 1 tablet (5 mg total) by mouth daily.   Ascorbic Acid (VITAMIN C PO) Take 1 tablet by mouth daily. Unknown strenght   aspirin EC 81 MG tablet Take 81 mg by mouth every evening. Swallow whole.   busPIRone (BUSPAR) 15 MG tablet Take 15 mg by mouth 2 (two) times daily.   carvedilol (COREG) 12.5 MG tablet Take 1 tablet by mouth twice daily (Patient taking differently: Take 12.5 mg by mouth 2 (two) times daily with a meal.)   Cholecalciferol 50 MCG (2000 UT) TABS Take 1 tablet by mouth daily.   famotidine (PEPCID) 20 MG tablet Take 20 mg by mouth every evening.   fenofibrate 54 MG tablet Take 1 tablet by mouth once daily (Patient taking differently: Take 54 mg by mouth daily.)   glipiZIDE (GLUCOTROL) 10 MG tablet Take 10 mg by mouth daily.   HYDROcodone-acetaminophen (NORCO) 10-325 MG tablet Take 1 tablet by mouth 4 (four) times daily as needed for pain.   isosorbide mononitrate (IMDUR) 120 MG 24 hr tablet Take 1 tablet by mouth once daily (Patient taking differently: Take 120 mg by mouth daily.)   Krill Oil 1000 MG CAPS Take 1,000 mg  by mouth every evening.   lisinopril (PRINIVIL,ZESTRIL) 5 MG tablet Take 5 mg by mouth daily.   loratadine (CLARITIN) 10 MG tablet Take 10 mg by mouth every evening.   nitroGLYCERIN (NITROSTAT) 0.4 MG SL tablet Place 1 tablet (0.4 mg total) under the tongue as needed for chest pain.   OZEMPIC, 1 MG/DOSE, 4 MG/3ML SOPN Inject 1 mg into the skin once a week. Mondays   pantoprazole (PROTONIX) 40 MG tablet Take 40 mg by mouth every evening.   ranolazine (RANEXA) 500 MG 12 hr tablet Take 1 tablet (500 mg total) by mouth 2 (two) times daily.   rosuvastatin (CRESTOR) 20 MG tablet Take 20 mg by mouth every evening.   ticagrelor (BRILINTA) 90 MG TABS tablet Take 90 mg by mouth 2 (two) times daily.   zinc gluconate 50 MG  tablet Take 1 tablet by mouth daily.     Allergies:   Adhesive [tape], Percocet [oxycodone-acetaminophen], and Prednisone   Social History   Socioeconomic History   Marital status: Married    Spouse name: Not on file   Number of children: Not on file   Years of education: Not on file   Highest education level: Not on file  Occupational History   Not on file  Tobacco Use   Smoking status: Former   Smokeless tobacco: Current    Types: Snuff  Vaping Use   Vaping Use: Never used  Substance and Sexual Activity   Alcohol use: Not Currently   Drug use: Never   Sexual activity: Not on file  Other Topics Concern   Not on file  Social History Narrative   Not on file   Social Determinants of Health   Financial Resource Strain: Not on file  Food Insecurity: Not on file  Transportation Needs: Not on file  Physical Activity: Not on file  Stress: Not on file  Social Connections: Not on file     Family History: The patient's family history includes Aneurysm in his mother; Cancer in his father; Diabetes in his mother; Heart attack in his brother and paternal uncle; Heart disease in his brother. ROS:   Please see the history of present illness.    All 14 point review of systems negative except as described per history of present illness  EKGs/Labs/Other Studies Reviewed:      Recent Labs: 04/22/2021: ALT 25; Magnesium 1.9; TSH 2.795 04/23/2021: BUN 12; Creatinine, Ser 1.06; Potassium 4.2; Sodium 132 04/24/2021: Hemoglobin 15.5; Platelets 203  Recent Lipid Panel    Component Value Date/Time   CHOL 126 04/23/2021 0604   TRIG 161 (H) 04/23/2021 0604   HDL 30 (L) 04/23/2021 0604   CHOLHDL 4.2 04/23/2021 0604   VLDL 32 04/23/2021 0604   LDLCALC 64 04/23/2021 0604    Physical Exam:    VS:  BP 130/70 (BP Location: Left Arm, Patient Position: Sitting)    Pulse 66    Ht 5\' 7"  (1.702 m)    Wt 286 lb 3.2 oz (129.8 kg)    SpO2 96%    BMI 44.83 kg/m     Wt Readings from Last 3  Encounters:  11/18/21 286 lb 3.2 oz (129.8 kg)  08/10/21 280 lb 6.4 oz (127.2 kg)  05/31/21 273 lb (123.8 kg)     GEN:  Well nourished, well developed in no acute distress HEENT: Normal NECK: No JVD; No carotid bruits LYMPHATICS: No lymphadenopathy CARDIAC: RRR, no murmurs, no rubs, no gallops RESPIRATORY:  Clear to auscultation without rales, wheezing  or rhonchi  ABDOMEN: Soft, non-tender, non-distended MUSCULOSKELETAL:  No edema; No deformity  SKIN: Warm and dry LOWER EXTREMITIES: no swelling NEUROLOGIC:  Alert and oriented x 3 PSYCHIATRIC:  Normal affect   ASSESSMENT:    1. Coronary artery disease of native artery of native heart with stable angina pectoris ([place])   2. Essential (primary) hypertension   3. Diabetes mellitus without complication ([place])   4. S/P CABG (coronary artery bypass graft)    PLAN:    In order of problems listed above:  Coronary disease stable from the 20-week does have stable angina pectoris currently in classification 2.  It happened only with extreme exertion. Essential hypertension: Blood pressure well controlled continue present management. Diabetes mellitus to be followed by internal medicine team I did review K PN which will make his last hemoglobin A1c of 7.2 this is from 04/22/2021.  I told him to talk to primary care physician to maximize his medication improve control of his diabetes. Dyslipidemia he is taking high intensity statin which I will continue.  I did review his cholesterol from K PN which show LDL 64 HDL 30 and this is done by primary care physician.  Good cholesterol profile we will continue present management.   Medication Adjustments/Labs and Tests Ordered: Current medicines are reviewed at length with the patient today.  Concerns regarding medicines are outlined above.  No orders of the defined types were placed in this encounter.  Medication changes: No orders of the defined types were placed in this  encounter.   Signed, Park Liter, MD, [facility] 11/18/2021 3:43 PM    [place]

## 2021-11-18 NOTE — Patient Instructions (Signed)

## 2022-01-10 ENCOUNTER — Other Ambulatory Visit: Payer: Self-pay | Admitting: Physician Assistant

## 2022-03-14 ENCOUNTER — Other Ambulatory Visit: Payer: Self-pay | Admitting: Cardiology

## 2022-03-14 NOTE — Telephone Encounter (Signed)
Rx refill sent to pharmacy. 

## 2022-04-22 ENCOUNTER — Encounter: Payer: Self-pay | Admitting: Cardiology

## 2022-04-22 ENCOUNTER — Ambulatory Visit (INDEPENDENT_AMBULATORY_CARE_PROVIDER_SITE_OTHER): Payer: Medicare Other | Admitting: Cardiology

## 2022-04-22 VITALS — BP 140/70 | HR 55 | Ht 67.0 in | Wt 275.0 lb

## 2022-04-22 DIAGNOSIS — G4733 Obstructive sleep apnea (adult) (pediatric): Secondary | ICD-10-CM

## 2022-04-22 DIAGNOSIS — Z951 Presence of aortocoronary bypass graft: Secondary | ICD-10-CM

## 2022-04-22 DIAGNOSIS — E1165 Type 2 diabetes mellitus with hyperglycemia: Secondary | ICD-10-CM | POA: Diagnosis not present

## 2022-04-22 DIAGNOSIS — I10 Essential (primary) hypertension: Secondary | ICD-10-CM | POA: Diagnosis not present

## 2022-04-22 DIAGNOSIS — I25118 Atherosclerotic heart disease of native coronary artery with other forms of angina pectoris: Secondary | ICD-10-CM | POA: Diagnosis not present

## 2022-04-22 MED ORDER — NITROGLYCERIN 0.4 MG SL SUBL: 0.4000 mg | SUBLINGUAL_TABLET | SUBLINGUAL | 3 refills | Status: AC | PRN

## 2022-04-22 MED ORDER — AMLODIPINE BESYLATE 5 MG PO TABS: 5.0000 mg | ORAL_TABLET | Freq: Two times a day (BID) | ORAL | 3 refills | Status: DC

## 2022-04-22 NOTE — Patient Instructions (Addendum)
Medication Instructions:  Your physician has recommended you make the following change in your medication:   Increase your Amlodipine to 5 mg twice daily.  *If you need a refill on your cardiac medications before your next appointment, please call your pharmacy*   Lab Work: None ordered If you have labs (blood work) drawn today and your tests are completely normal, you will receive your results only by: [place] (if you have MyChart) OR A paper copy in the mail If you have any lab test that is abnormal or we need to change your treatment, we will call you to review the results.   Testing/Procedures: None ordered   Follow-Up: At [facility], you and your health needs are our priority.  As part of our continuing mission to provide you with exceptional heart care, we have created designated Provider Care Teams.  These Care Teams include your primary Cardiologist (physician) and Advanced Practice Providers (APPs -  Physician Assistants and Nurse Practitioners) who all work together to provide you with the care you need, when you need it.  We recommend signing up for the patient portal called "MyChart".  Sign up information is provided on this After Visit Summary.  MyChart is used to connect with patients for Virtual Visits (Telemedicine).  Patients are able to view lab/test results, encounter notes, upcoming appointments, etc.  Non-urgent messages can be sent to your provider as well.   To learn more about what you can do with MyChart, go to [email or web address].    Your next appointment:   1 month(s)  The format for your next appointment:   In Person  Provider:   Jenne Campus, MD   Other Instructions NA

## 2022-04-22 NOTE — Progress Notes (Signed)
Cardiology Office Note:    Date:  04/22/2022   ID:  Peter Garrison, DOB [birth year], MRN [id number]  PCP:  Peter Beckwith, PA-C  Cardiologist:  Peter Campus, MD    Referring MD: Peter Garrison, *   Chief Complaint  Patient presents with   Follow-up  Still have some episode of chest pain  History of Present Illness:    Peter Garrison is a 61 y.o. male   with past medical history significant for coronary artery disease, he did have coronary artery bypass graft done in 2010 with SVG to LAD, SVG to RCA SVG to obtuse marginal branch.  He also got morbid obesity essential hypertension dyslipidemia.  Few months ago ago he presented to the hospital with chest pain.  I he was diagnosed with non-STEMI.  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  It was felt to be relatively old meaning more than 36 hours.  He was completely asymptomatic at the time of intervention, and that is why decision of medical therapy has been made.  Comes today to my office for follow-up.  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  He said about once a week he will get some pain.  He does take nitroglycerin with good relief.  Otherwise seems to be doing well  Past Medical History:  Diagnosis Date   Cancer ([place])    Diabetes mellitus without complication ([place])    Hypertension    MI (myocardial infarction) ([place])     Past Surgical History:  Procedure Laterality Date   APPENDECTOMY     CORONARY ARTERY BYPASS GRAFT     KIDNEY SURGERY     KNEE SURGERY     LEFT HEART CATH AND CORS/GRAFTS ANGIOGRAPHY N/A 04/22/2021   Procedure: LEFT HEART CATH AND CORS/GRAFTS ANGIOGRAPHY;  Surgeon: Belva Crome, MD;  Location: [facility];  Service: Cardiovascular;  Laterality: N/A;    Current Medications: Current Meds  Medication Sig   amLODipine (NORVASC) 5 MG tablet Take 1 tablet (5 mg total) by  mouth daily.   Ascorbic Acid (VITAMIN C PO) Take 1 tablet by mouth daily. Unknown strenght   aspirin EC 81 MG tablet Take 81 mg by mouth every evening. Swallow whole.   busPIRone (BUSPAR) 15 MG tablet Take 15 mg by mouth 2 (two) times daily.   carvedilol (COREG) 12.5 MG tablet Take 1 tablet by mouth twice daily (Patient taking differently: Take 12.5 mg by mouth 2 (two) times daily with a meal.)   Cholecalciferol 50 MCG (2000 UT) TABS Take 1 tablet by mouth daily.   famotidine (PEPCID) 20 MG tablet Take 20 mg by mouth every evening.   fenofibrate 54 MG tablet Take 1 tablet (54 mg total) by mouth daily.   glipiZIDE (GLUCOTROL) 10 MG tablet Take 10 mg by mouth daily.   HYDROcodone-acetaminophen (NORCO) 10-325 MG tablet Take 1 tablet by mouth 4 (four) times daily as needed for pain.   isosorbide mononitrate (IMDUR) 120 MG 24 hr tablet Take 1 tablet by mouth once daily (Patient taking differently: Take 120 mg by mouth daily.)   Krill Oil 1000 MG CAPS Take 1,000 mg by mouth every evening.   lisinopril (PRINIVIL,ZESTRIL) 5 MG tablet Take 5 mg by mouth daily.   loratadine (CLARITIN) 10 MG tablet Take 10 mg by mouth every evening.   nitroGLYCERIN (NITROSTAT) 0.4 MG SL tablet Place  1 tablet (0.4 mg total) under the tongue as needed for chest pain.   OZEMPIC, 1 MG/DOSE, 4 MG/3ML SOPN Inject 1 mg into the skin once a week. Mondays   pantoprazole (PROTONIX) 40 MG tablet Take 40 mg by mouth every evening.   ranolazine (RANEXA) 500 MG 12 hr tablet Take 1 tablet (500 mg total) by mouth 2 (two) times daily.   rosuvastatin (CRESTOR) 20 MG tablet Take 20 mg by mouth every evening.   ticagrelor (BRILINTA) 90 MG TABS tablet Take 90 mg by mouth 2 (two) times daily.   zinc gluconate 50 MG tablet Take 1 tablet by mouth daily.     Allergies:   Adhesive [tape], Percocet [oxycodone-acetaminophen], and Prednisone   Social History   Socioeconomic History   Marital status: Married    Spouse name: Not on file    Number of children: Not on file   Years of education: Not on file   Highest education level: Not on file  Occupational History   Not on file  Tobacco Use   Smoking status: Former   Smokeless tobacco: Current    Types: Snuff  Vaping Use   Vaping Use: Never used  Substance and Sexual Activity   Alcohol use: Not Currently   Drug use: Never   Sexual activity: Not on file  Other Topics Concern   Not on file  Social History Narrative   Not on file   Social Determinants of Health   Financial Resource Strain: Not on file  Food Insecurity: Not on file  Transportation Needs: Not on file  Physical Activity: Not on file  Stress: Not on file  Social Connections: Not on file     Family History: The patient's family history includes Aneurysm in his mother; Cancer in his father; Diabetes in his mother; Heart attack in his brother and paternal uncle; Heart disease in his brother. ROS:   Please see the history of present illness.    All 14 point review of systems negative except as described per history of present illness  EKGs/Labs/Other Studies Reviewed:      Recent Labs: 04/22/2021: ALT 25; Magnesium 1.9; TSH 2.795 04/23/2021: BUN 12; Creatinine, Ser 1.06; Potassium 4.2; Sodium 132 04/24/2021: Hemoglobin 15.5; Platelets 203  Recent Lipid Panel    Component Value Date/Time   CHOL 126 04/23/2021 0604   TRIG 161 (H) 04/23/2021 0604   HDL 30 (L) 04/23/2021 0604   CHOLHDL 4.2 04/23/2021 0604   VLDL 32 04/23/2021 0604   LDLCALC 64 04/23/2021 0604    Physical Exam:    VS:  BP 140/70 (BP Location: Left Arm, Patient Position: Sitting)   Pulse (!) 55   Ht '5\' 7"'$  (1.702 m)   Wt 275 lb (124.7 kg)   SpO2 94%   BMI 43.07 kg/m     Wt Readings from Last 3 Encounters:  04/22/22 275 lb (124.7 kg)  11/18/21 286 lb 3.2 oz (129.8 kg)  08/10/21 280 lb 6.4 oz (127.2 kg)     GEN:  Well nourished, well developed in no acute distress HEENT: Normal NECK: No JVD; No carotid  bruits LYMPHATICS: No lymphadenopathy CARDIAC: RRR, no murmurs, no rubs, no gallops RESPIRATORY:  Clear to auscultation without rales, wheezing or rhonchi  ABDOMEN: Soft, non-tender, non-distended MUSCULOSKELETAL:  No edema; No deformity  SKIN: Warm and dry LOWER EXTREMITIES: no swelling NEUROLOGIC:  Alert and oriented x 3 PSYCHIATRIC:  Normal affect   ASSESSMENT:    1. Essential (primary) hypertension   2.  Coronary artery disease of native artery of native heart with stable angina pectoris ([place])   3. Obstructive sleep apnea   4. Type 2 diabetes mellitus with hyperglycemia, without long-term current use of insulin (HCC)   5. S/P CABG (coronary artery bypass graft)    PLAN:    In order of problems listed above:  Coronary disease.  I will try to maximize his medical therapy.  I will double the dose of Norvasc he will take 5 mg twice daily.  We will continue Coreg 12.5 twice a day, I cannot increase this medication because of bradycardia, continue with Imdur, will continue with her nose on the present dose.  If he fails medical therapy then we will reconsider reassessment for potential intervention. Essential hypertension elevated and hopefully with increasing dose of amlodipine blood pressure will be better and he will feel better Obstructive sleep apnea followed by antimedicine team Type 2 diabetes that being managed by primary care physicians.  Hemoglobin A1c 7.2 however this data is from last year.   Medication Adjustments/Labs and Tests Ordered: Current medicines are reviewed at length with the patient today.  Concerns regarding medicines are outlined above.  No orders of the defined types were placed in this encounter.  Medication changes: No orders of the defined types were placed in this encounter.   Signed, Park Liter, MD, [facility] 04/22/2022 11:53 AM    [place]

## 2022-05-24 ENCOUNTER — Encounter: Payer: Self-pay | Admitting: Cardiology

## 2022-05-24 ENCOUNTER — Ambulatory Visit (INDEPENDENT_AMBULATORY_CARE_PROVIDER_SITE_OTHER): Payer: Medicare Other | Admitting: Cardiology

## 2022-05-24 VITALS — BP 122/58 | HR 55 | Ht 67.0 in | Wt 276.2 lb

## 2022-05-24 DIAGNOSIS — I10 Essential (primary) hypertension: Secondary | ICD-10-CM

## 2022-05-24 DIAGNOSIS — I25118 Atherosclerotic heart disease of native coronary artery with other forms of angina pectoris: Secondary | ICD-10-CM | POA: Diagnosis not present

## 2022-05-24 DIAGNOSIS — G4733 Obstructive sleep apnea (adult) (pediatric): Secondary | ICD-10-CM

## 2022-05-24 DIAGNOSIS — E119 Type 2 diabetes mellitus without complications: Secondary | ICD-10-CM | POA: Diagnosis not present

## 2022-05-24 NOTE — Patient Instructions (Signed)

## 2022-05-24 NOTE — Progress Notes (Signed)
Cardiology Office Note:    Date:  05/24/2022   ID:  Peter Garrison, DOB 02-13-1961, MRN [id number]  PCP:  Dellia Beckwith, PA-C  Cardiologist:  Jenne Campus, MD    Referring MD: Dellia Beckwith, *   Chief Complaint  Patient presents with   Medication Management  Doing fine, asymptomatic  History of Present Illness:    Peter Garrison is a 61 y.o. male  with past medical history significant for coronary artery disease, he did have coronary artery bypass graft done in 2010 with SVG to LAD, SVG to RCA SVG to obtuse marginal branch.  He also got morbid obesity essential hypertension dyslipidemia.  Few months ago ago he presented to the hospital with chest pain.  I he was diagnosed with non-STEMI.  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  It was felt to be relatively old meaning more than 36 hours.  He was completely asymptomatic at the time of intervention, and that is why decision of medical therapy has been made.  He comes today to my office for follow-up.  Overall doing very well.  She is on increased dose of amlodipine he does not have any chest pain.  He did notice some swelling of lower extremities only mild and does not bother him much he is very happy the way he feels.  No palpitations no dizziness  Past Medical History:  Diagnosis Date   Cancer ([place])    Diabetes mellitus without complication ([place])    Hypertension    MI (myocardial infarction) ([place])     Past Surgical History:  Procedure Laterality Date   APPENDECTOMY     CORONARY ARTERY BYPASS GRAFT     KIDNEY SURGERY     KNEE SURGERY     LEFT HEART CATH AND CORS/GRAFTS ANGIOGRAPHY N/A 04/22/2021   Procedure: LEFT HEART CATH AND CORS/GRAFTS ANGIOGRAPHY;  Surgeon: Belva Crome, MD;  Location: [facility];  Service: Cardiovascular;  Laterality: N/A;    Current Medications: Current Meds   Medication Sig   amLODipine (NORVASC) 5 MG tablet Take 1 tablet (5 mg total) by mouth 2 (two) times daily.   Ascorbic Acid (VITAMIN C PO) Take 1 tablet by mouth daily. Unknown strenght   aspirin EC 81 MG tablet Take 81 mg by mouth every evening. Swallow whole.   busPIRone (BUSPAR) 15 MG tablet Take 15 mg by mouth 2 (two) times daily.   carvedilol (COREG) 12.5 MG tablet Take 1 tablet by mouth twice daily (Patient taking differently: Take 12.5 mg by mouth 2 (two) times daily with a meal.)   Cholecalciferol 50 MCG (2000 UT) TABS Take 1 tablet by mouth daily.   famotidine (PEPCID) 20 MG tablet Take 20 mg by mouth every evening.   fenofibrate 54 MG tablet Take 1 tablet (54 mg total) by mouth daily.   glipiZIDE (GLUCOTROL) 10 MG tablet Take 10 mg by mouth daily.   HYDROcodone-acetaminophen (NORCO) 10-325 MG tablet Take 1 tablet by mouth 4 (four) times daily as needed for pain.   isosorbide mononitrate (IMDUR) 120 MG 24 hr tablet Take 1 tablet by mouth once daily (Patient taking differently: Take 120 mg by mouth daily.)   Krill Oil 1000 MG CAPS Take 1,000 mg by mouth every evening.   lisinopril (PRINIVIL,ZESTRIL) 5 MG tablet Take 5 mg by mouth daily.   loratadine (CLARITIN) 10 MG tablet Take  10 mg by mouth every evening.   nitroGLYCERIN (NITROSTAT) 0.4 MG SL tablet Place 1 tablet (0.4 mg total) under the tongue as needed for chest pain.   OZEMPIC, 1 MG/DOSE, 4 MG/3ML SOPN Inject 1 mg into the skin once a week. Mondays   pantoprazole (PROTONIX) 40 MG tablet Take 40 mg by mouth every evening.   ranolazine (RANEXA) 500 MG 12 hr tablet Take 1 tablet (500 mg total) by mouth 2 (two) times daily.   rosuvastatin (CRESTOR) 20 MG tablet Take 20 mg by mouth every evening.   ticagrelor (BRILINTA) 90 MG TABS tablet Take 90 mg by mouth 2 (two) times daily.   zinc gluconate 50 MG tablet Take 1 tablet by mouth daily.     Allergies:   Adhesive [tape], Percocet [oxycodone-acetaminophen], and Prednisone   Social  History   Socioeconomic History   Marital status: Married    Spouse name: Not on file   Number of children: Not on file   Years of education: Not on file   Highest education level: Not on file  Occupational History   Not on file  Tobacco Use   Smoking status: Former   Smokeless tobacco: Current    Types: Snuff  Vaping Use   Vaping Use: Never used  Substance and Sexual Activity   Alcohol use: Not Currently   Drug use: Never   Sexual activity: Not on file  Other Topics Concern   Not on file  Social History Narrative   Not on file   Social Determinants of Health   Financial Resource Strain: Not on file  Food Insecurity: Not on file  Transportation Needs: Not on file  Physical Activity: Not on file  Stress: Not on file  Social Connections: Not on file     Family History: The patient's family history includes Aneurysm in his mother; Cancer in his father; Diabetes in his mother; Heart attack in his brother and paternal uncle; Heart disease in his brother. ROS:   Please see the history of present illness.    All 14 point review of systems negative except as described per history of present illness  EKGs/Labs/Other Studies Reviewed:      Recent Labs: No results found for requested labs within last 365 days.  Recent Lipid Panel    Component Value Date/Time   CHOL 126 04/23/2021 0604   TRIG 161 (H) 04/23/2021 0604   HDL 30 (L) 04/23/2021 0604   CHOLHDL 4.2 04/23/2021 0604   VLDL 32 04/23/2021 0604   LDLCALC 64 04/23/2021 0604    Physical Exam:    VS:  BP (!) 122/58 (BP Location: Left Arm, Patient Position: Sitting)   Pulse (!) 55   Ht '5\' 7"'$  (1.702 m)   Wt 276 lb 3.2 oz (125.3 kg)   SpO2 91%   BMI 43.26 kg/m     Wt Readings from Last 3 Encounters:  05/24/22 276 lb 3.2 oz (125.3 kg)  04/22/22 275 lb (124.7 kg)  11/18/21 286 lb 3.2 oz (129.8 kg)     GEN:  Well nourished, well developed in no acute distress HEENT: Normal NECK: No JVD; No carotid  bruits LYMPHATICS: No lymphadenopathy CARDIAC: RRR, no murmurs, no rubs, no gallops RESPIRATORY:  Clear to auscultation without rales, wheezing or rhonchi  ABDOMEN: Soft, non-tender, non-distended MUSCULOSKELETAL:  No edema; No deformity  SKIN: Warm and dry LOWER EXTREMITIES: no swelling NEUROLOGIC:  Alert and oriented x 3 PSYCHIATRIC:  Normal affect   ASSESSMENT:    1.  Coronary artery disease of native artery of native heart with stable angina pectoris ([place])   2. Essential (primary) hypertension   3. Obstructive sleep apnea   4. Diabetes mellitus without complication (HCC)    PLAN:    In order of problems listed above:  Coronary artery disease stable pattern.  He is on appropriate medical therapy will continue. We will hypertension blood pressure well controlled continue present management. Obstructive sleep apnea CPAP mask continue using it Diabetes.  I did review K PN which show me his hemoglobin A1c 7.2%. Dyslipidemia he is on high intense statin form of Crestor which I will continue.  His K PN show me data from 04/23/2021 with LDL of 64 HDL 30 he is telling me that recently his primary care physician recheck his cholesterol everything was excellent.  I will call office of primary care physician to get copy of it.   Medication Adjustments/Labs and Tests Ordered: Current medicines are reviewed at length with the patient today.  Concerns regarding medicines are outlined above.  No orders of the defined types were placed in this encounter.  Medication changes: No orders of the defined types were placed in this encounter.   Signed, Park Liter, MD, [facility] 05/24/2022 8:13 AM    [place]

## 2022-06-01 IMAGING — DX DG CHEST 1V PORT
1 series · 1 of 1 positions shown · non-contrast
Comparison: Portable chest 03/01/2015 and earlier.

CLINICAL DATA: 59-year-old male with chest pain for 2-3 days.
Nitroglycerin without relief.

EXAM:
PORTABLE CHEST 1 VIEW

[chest]
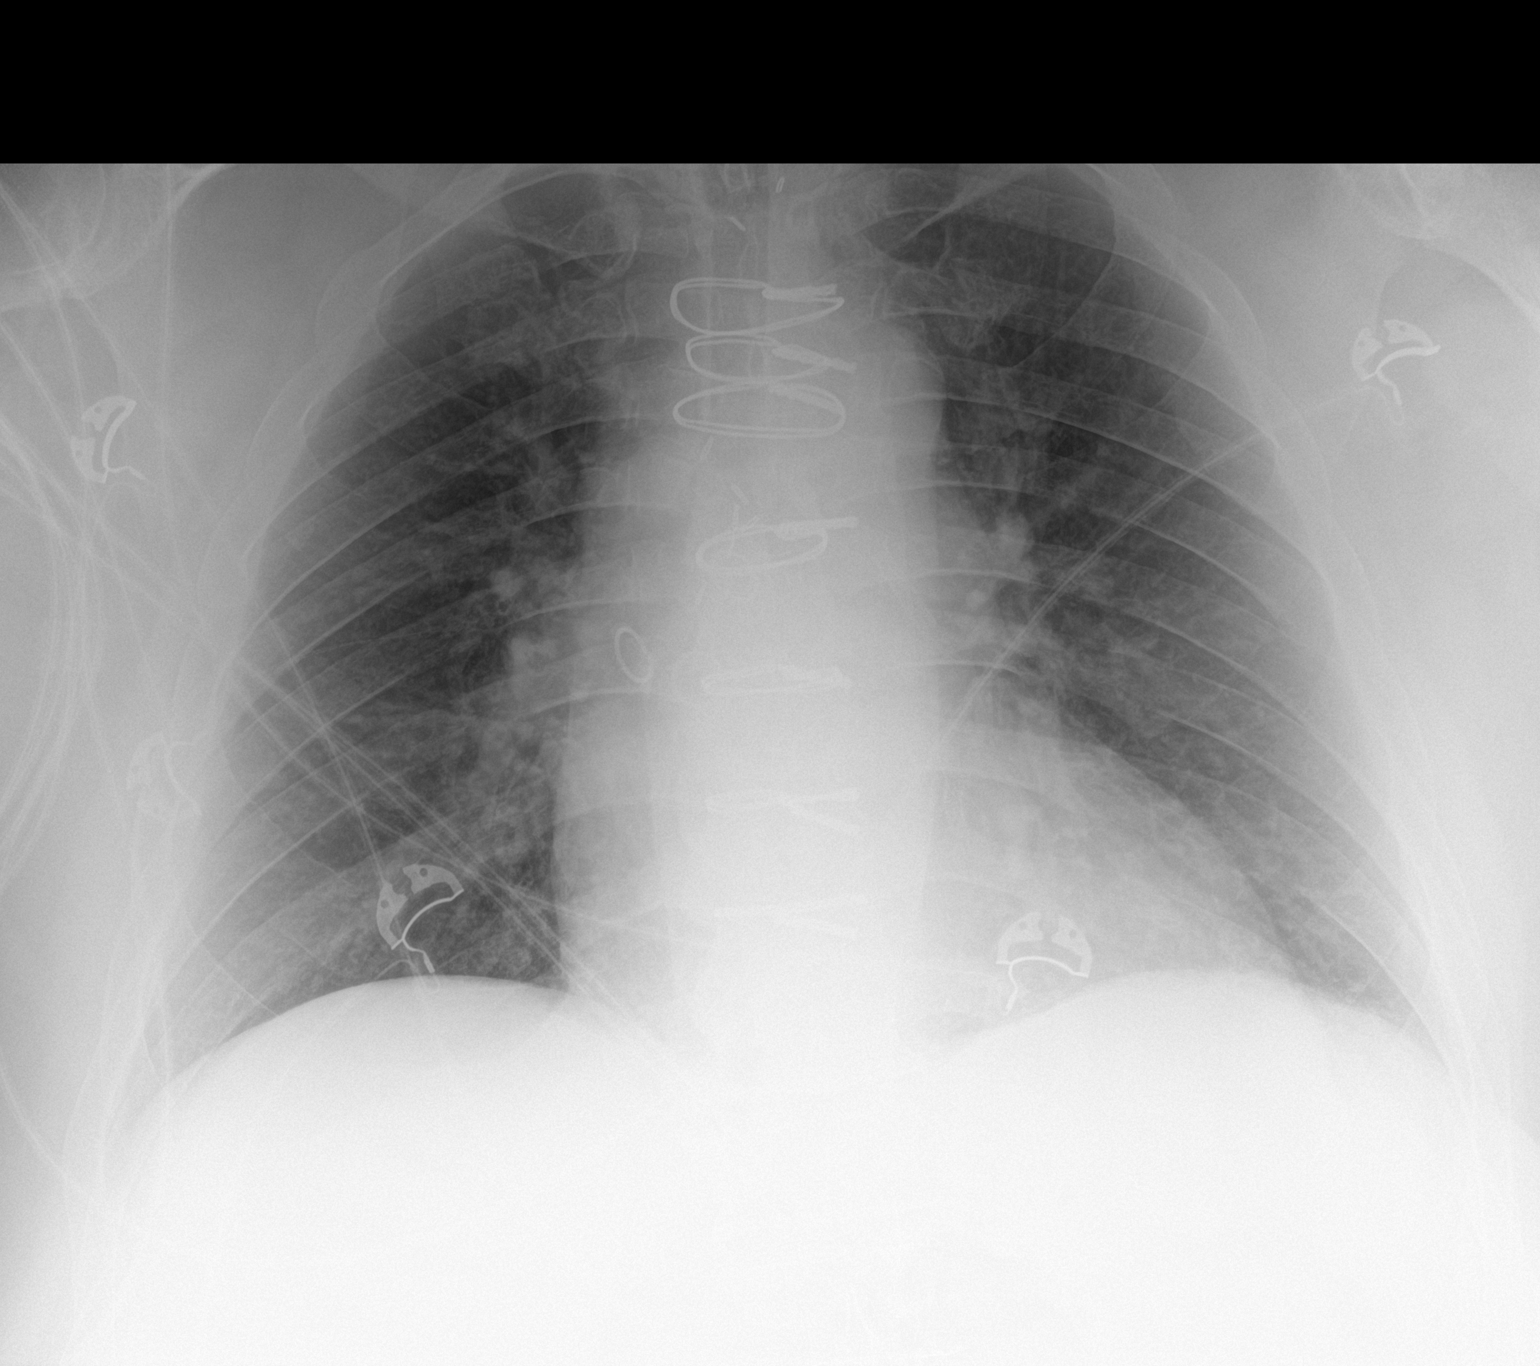

[1 of 1 positions shown; findings below may reference images not displayed]

FINDINGS: Portable AP upright view at 5833 hours. Prior CABG. Lung volumes and
mediastinal contours are stable and within normal limits. Visualized
tracheal air column is within normal limits. Allowing for portable
technique the lungs are clear. No pneumothorax or pleural effusion.
No acute osseous abnormality identified.
IMPRESSION: No acute cardiopulmonary abnormality.

## 2022-07-12 ENCOUNTER — Other Ambulatory Visit: Payer: Self-pay | Admitting: Cardiology

## 2022-07-13 NOTE — Telephone Encounter (Signed)
Refills to pharmacy 

## 2023-01-02 ENCOUNTER — Other Ambulatory Visit: Payer: Self-pay | Admitting: Cardiology

## 2023-04-11 ENCOUNTER — Ambulatory Visit: Payer: Medicare Other | Attending: Cardiology | Admitting: Cardiology

## 2023-04-11 VITALS — BP 136/72 | HR 63 | Ht 67.0 in | Wt 276.0 lb

## 2023-04-11 DIAGNOSIS — I10 Essential (primary) hypertension: Secondary | ICD-10-CM

## 2023-04-11 DIAGNOSIS — G4733 Obstructive sleep apnea (adult) (pediatric): Secondary | ICD-10-CM

## 2023-04-11 DIAGNOSIS — I25118 Atherosclerotic heart disease of native coronary artery with other forms of angina pectoris: Secondary | ICD-10-CM

## 2023-04-11 DIAGNOSIS — Z951 Presence of aortocoronary bypass graft: Secondary | ICD-10-CM | POA: Diagnosis present

## 2023-04-11 DIAGNOSIS — E119 Type 2 diabetes mellitus without complications: Secondary | ICD-10-CM

## 2023-04-11 DIAGNOSIS — R0609 Other forms of dyspnea: Secondary | ICD-10-CM | POA: Diagnosis present

## 2023-04-11 DIAGNOSIS — E782 Mixed hyperlipidemia: Secondary | ICD-10-CM | POA: Diagnosis present

## 2023-04-11 DIAGNOSIS — Z7984 Long term (current) use of oral hypoglycemic drugs: Secondary | ICD-10-CM

## 2023-04-11 NOTE — Addendum Note (Signed)
Addended by: Baldo Ash D on: 04/11/2023 09:13 AM   Modules accepted: Orders

## 2023-04-11 NOTE — Patient Instructions (Signed)
Medication Instructions:  Your physician recommends that you continue on your current medications as directed. Please refer to the Current Medication list given to you today.  *If you need a refill on your cardiac medications before your next appointment, please call your pharmacy*   Lab Work: Lipid panel today If you have labs (blood work) drawn today and your tests are completely normal, you will receive your results only by: MyChart Message (if you have MyChart) OR A paper copy in the mail If you have any lab test that is abnormal or we need to change your treatment, we will call you to review the results.   Testing/Procedures: Your physician has requested that you have an echocardiogram. Echocardiography is a painless test that uses sound waves to create images of your heart. It provides your doctor with information about the size and shape of your heart and how well your heart's chambers and valves are working. This procedure takes approximately one hour. There are no restrictions for this procedure. Please do NOT wear cologne, perfume, aftershave, or lotions (deodorant is allowed). Please arrive 15 minutes prior to your appointment time.    Follow-Up: At [facility], you and your health needs are our priority.  As part of our continuing mission to provide you with exceptional heart care, we have created designated Provider Care Teams.  These Care Teams include your primary Cardiologist (physician) and Advanced Practice Providers (APPs -  Physician Assistants and Nurse Practitioners) who all work together to provide you with the care you need, when you need it.  We recommend signing up for the patient portal called "MyChart".  Sign up information is provided on this After Visit Summary.  MyChart is used to connect with patients for Virtual Visits (Telemedicine).  Patients are able to view lab/test results, encounter notes, upcoming appointments, etc.  Non-urgent messages can be sent to  your provider as well.   To learn more about what you can do with MyChart, go to [email or web address].    Your next appointment:   6 month(s)  The format for your next appointment:   In Person  Provider:   Robert Krasowski, MD    Other Instructions NA  

## 2023-04-11 NOTE — Progress Notes (Signed)
Cardiology Office Note:    Date:  04/11/2023   ID:  [name], DOB 09/04/1961, MRN [id number]  PCP:  Marinell Blight, PA-C  Cardiologist:  Gypsy Balsam, MD    Referring MD: Marinell Blight, *   Chief Complaint  Patient presents with   Follow-up  Doing fine but short of breath  History of Present Illness:    Peter Garrison is a 62 y.o. male past medical history significant for coronary artery disease he did have coronary bypass graft done in 2010 with SVG to LAD D, SVG to RCA, SVG to obtuse marginal branch additional problem include morbid obesity, essential hypertension, dyslipidemia, diabetes.  Last cardiac catheterization done in summer 2022 which showed completely occluded saphenous graft going to circumflex obtuse marginal, however since it was a late presentation no intervention has been performed. He comes today to months for follow-up overall he is doing fine he complained of being weak tired exhausted short of breath but no chest pain.  So far every single time he did have a heart problem he end up having chest pain at this time likely he does not have any.  Past Medical History:  Diagnosis Date   Cancer (HCC)    Diabetes mellitus without complication (HCC)    Hypertension    MI (myocardial infarction) (HCC)     Past Surgical History:  Procedure Laterality Date   APPENDECTOMY     CORONARY ARTERY BYPASS GRAFT     KIDNEY SURGERY     KNEE SURGERY     LEFT HEART CATH AND CORS/GRAFTS ANGIOGRAPHY N/A 04/22/2021   Procedure: LEFT HEART CATH AND CORS/GRAFTS ANGIOGRAPHY;  Surgeon: Lyn Records, MD;  [facility];  Service: Cardiovascular;  Laterality: N/A;    Current Medications: Current Meds  Medication Sig   amLODipine (NORVASC) 5 MG tablet Take 1 tablet (5 mg total) by mouth 2 (two) times daily.   Ascorbic Acid (VITAMIN C PO) Take 1 tablet by mouth daily. Unknown strenght   aspirin EC 81 MG tablet Take 81 mg by mouth every evening. Swallow  whole.   busPIRone (BUSPAR) 15 MG tablet Take 15 mg by mouth 2 (two) times daily.   carvedilol (COREG) 12.5 MG tablet Take 1 tablet (12.5 mg total) by mouth 2 (two) times daily with a meal.   Cholecalciferol 50 MCG (2000 UT) TABS Take 1 tablet by mouth daily.   famotidine (PEPCID) 20 MG tablet Take 20 mg by mouth every evening.   fenofibrate 54 MG tablet Take 1 tablet by mouth once daily   glipiZIDE (GLUCOTROL) 10 MG tablet Take 10 mg by mouth daily.   HYDROcodone-acetaminophen (NORCO) 10-325 MG tablet Take 1 tablet by mouth 4 (four) times daily as needed for pain.   isosorbide mononitrate (IMDUR) 120 MG 24 hr tablet Take 1 tablet by mouth once daily (Patient taking differently: Take 120 mg by mouth daily.)   Krill Oil 1000 MG CAPS Take 1,000 mg by mouth every evening.   lisinopril (PRINIVIL,ZESTRIL) 5 MG tablet Take 5 mg by mouth daily.   loratadine (CLARITIN) 10 MG tablet Take 10 mg by mouth every evening.   nitroGLYCERIN (NITROSTAT) 0.4 MG SL tablet Place 1 tablet (0.4 mg total) under the tongue as needed for chest pain.   OZEMPIC, 1 MG/DOSE, 4 MG/3ML SOPN Inject 1 mg into the skin once a week. Mondays   pantoprazole (PROTONIX) 40 MG tablet Take 40 mg by mouth every evening.   ranolazine (RANEXA) 500 MG  12 hr tablet Take 1 tablet (500 mg total) by mouth 2 (two) times daily.   rosuvastatin (CRESTOR) 20 MG tablet Take 20 mg by mouth every evening.   ticagrelor (BRILINTA) 90 MG TABS tablet Take 90 mg by mouth 2 (two) times daily.   zinc gluconate 50 MG tablet Take 1 tablet by mouth daily.     Allergies:   Adhesive [tape], Percocet [oxycodone-acetaminophen], and Prednisone   Social History   Socioeconomic History   Marital status: Married    Spouse name: Not on file   Number of children: Not on file   Years of education: Not on file   Highest education level: Not on file  Occupational History   Not on file  Tobacco Use   Smoking status: Former   Smokeless tobacco: Current    Types:  Snuff  Vaping Use   Vaping Use: Never used  Substance and Sexual Activity   Alcohol use: Not Currently   Drug use: Never   Sexual activity: Not on file  Other Topics Concern   Not on file  Social History Narrative   Not on file   Social Determinants of Health   Financial Resource Strain: Not on file  Food Insecurity: Not on file  Transportation Needs: Not on file  Physical Activity: Not on file  Stress: Not on file  Social Connections: Not on file     Family History: The patient's family history includes Aneurysm in his mother; Cancer in his father; Diabetes in his mother; Heart attack in his brother and paternal uncle; Heart disease in his brother. ROS:   Please see the history of present illness.    All 14 point review of systems negative except as described per history of present illness  EKGs/Labs/Other Studies Reviewed:         Recent Labs: No results found for requested labs within last 365 days.  Recent Lipid Panel    Component Value Date/Time   CHOL 126 04/23/2021 0604   TRIG 161 (H) 04/23/2021 0604   HDL 30 (L) 04/23/2021 0604   CHOLHDL 4.2 04/23/2021 0604   VLDL 32 04/23/2021 0604   LDLCALC 64 04/23/2021 0604    Physical Exam:    VS:  BP 136/72 (BP Location: Left Arm, Patient Position: Sitting)   Pulse 63   Ht 5\' 7"  (1.702 m)   Wt 276 lb (125.2 kg)   SpO2 91%   BMI 43.23 kg/m     Wt Readings from Last 3 Encounters:  04/11/23 276 lb (125.2 kg)  05/24/22 276 lb 3.2 oz (125.3 kg)  04/22/22 275 lb (124.7 kg)     GEN:  Well nourished, well developed in no acute distress HEENT: Normal NECK: No JVD; No carotid bruits LYMPHATICS: No lymphadenopathy CARDIAC: RRR, no murmurs, no rubs, no gallops RESPIRATORY:  Clear to auscultation without rales, wheezing or rhonchi  ABDOMEN: Soft, non-tender, non-distended MUSCULOSKELETAL:  No edema; No deformity  SKIN: Warm and dry LOWER EXTREMITIES: no swelling NEUROLOGIC:  Alert and oriented x 3 PSYCHIATRIC:   Normal affect   ASSESSMENT:    1. Coronary artery disease of native artery of native heart with stable angina pectoris (HCC)   2. Essential (primary) hypertension   3. Obstructive sleep apnea   4. Diabetes mellitus without complication (HCC)   5. Mixed hyperlipidemia   6. S/P CABG (coronary artery bypass graft)    PLAN:    In order of problems listed above:  Coronary disease stable from that point to  an appropriate guideline directed medical therapy which I will continue. Essential hypertension: Blood pressure well-controlled continue present management. Obstructive sleep apnea living followed by antimedicine team. Dyslipidemia I did review his K PN which show me his LDL of 74 HDL 34 however this is from November of last year, will repeat fasting lipid profile. Dyspnea on exertion with fatigue.  I will ask him to have echocardiogram done to check left ventricle ejection fraction   Medication Adjustments/Labs and Tests Ordered: Current medicines are reviewed at length with the patient today.  Concerns regarding medicines are outlined above.  Orders Placed This Encounter  Procedures   EKG 12-Lead   Medication changes: No orders of the defined types were placed in this encounter.   Signed, Georgeanna Lea, MD, [facility] 04/11/2023 9:04 AM    [facility]

## 2023-04-12 LAB — LIPID PANEL
Chol/HDL Ratio: 4.1 ratio (ref 0.0–5.0)
Cholesterol, Total: 159 mg/dL (ref 100–199)
HDL: 39 mg/dL — ABNORMAL LOW (ref >39)
LDL Chol Calc (NIH): 78 mg/dL (ref 0–99)
Triglycerides: 259 mg/dL — ABNORMAL HIGH (ref 0–149)
VLDL Cholesterol Cal: 42 mg/dL — ABNORMAL HIGH (ref 5–40)

## 2023-04-14 ENCOUNTER — Other Ambulatory Visit: Payer: Self-pay | Admitting: Cardiology

## 2023-04-24 ENCOUNTER — Other Ambulatory Visit: Payer: Self-pay | Admitting: Cardiology

## 2023-04-25 ENCOUNTER — Telehealth: Payer: Self-pay

## 2023-04-25 MED ORDER — ROSUVASTATIN CALCIUM 40 MG PO TABS: 40.0000 mg | ORAL_TABLET | Freq: Every day | ORAL | 3 refills | Status: DC

## 2023-04-25 NOTE — Telephone Encounter (Signed)
Results reviewed with pt as per Dr. Vanetta Shawl note. Agreed to increase Crestor to 40mg  daily.  Pt verbalized understanding and had no additional questions. Routed to PCP

## 2023-05-10 ENCOUNTER — Ambulatory Visit: Payer: Medicare Other | Attending: Cardiology

## 2023-05-10 DIAGNOSIS — R0609 Other forms of dyspnea: Secondary | ICD-10-CM | POA: Diagnosis not present

## 2023-05-10 LAB — ECHOCARDIOGRAM COMPLETE
Area-P 1/2: 3.42 cm2
MV M vel: 3.1 m/s
MV Peak grad: 38.4 mmHg
S' Lateral: 3.4 cm

## 2023-05-10 MED ORDER — PERFLUTREN LIPID MICROSPHERE
1.0000 mL | INTRAVENOUS | Status: AC | PRN
Start: 2023-05-10 — End: 2023-05-10
  Administered 2023-05-10: 9 mL via INTRAVENOUS

## 2023-05-12 ENCOUNTER — Telehealth: Payer: Self-pay

## 2023-05-12 NOTE — Telephone Encounter (Signed)
Patient notified through my chart.

## 2023-05-12 NOTE — Telephone Encounter (Signed)
-----   Message from Gypsy Balsam sent at 05/12/2023  8:50 AM EDT ----- Echocardiogram showed preserved left ventricle ejection fraction, mild mitral valve regurgitation, overall looks good

## 2023-07-10 ENCOUNTER — Other Ambulatory Visit: Payer: Self-pay | Admitting: Cardiology

## 2023-10-09 ENCOUNTER — Other Ambulatory Visit: Payer: Self-pay | Admitting: Cardiology

## 2024-04-09 ENCOUNTER — Other Ambulatory Visit: Payer: Self-pay | Admitting: Cardiology

## 2024-05-18 ENCOUNTER — Other Ambulatory Visit: Payer: Self-pay | Admitting: Cardiology

## 2024-07-08 ENCOUNTER — Other Ambulatory Visit: Payer: Self-pay | Admitting: Cardiology

## 2024-07-11 ENCOUNTER — Other Ambulatory Visit: Payer: Self-pay | Admitting: Cardiology

## 2024-07-24 ENCOUNTER — Other Ambulatory Visit: Payer: Self-pay | Admitting: Cardiology

## 2024-08-03 ENCOUNTER — Other Ambulatory Visit: Payer: Self-pay | Admitting: Cardiology

## 2024-08-16 ENCOUNTER — Encounter: Payer: Self-pay | Admitting: Cardiology

## 2024-08-16 ENCOUNTER — Ambulatory Visit: Attending: Cardiology | Admitting: Cardiology

## 2024-08-16 VITALS — BP 110/64 | Ht 67.0 in | Wt 267.0 lb

## 2024-08-16 DIAGNOSIS — I10 Essential (primary) hypertension: Secondary | ICD-10-CM | POA: Diagnosis present

## 2024-08-16 DIAGNOSIS — Z951 Presence of aortocoronary bypass graft: Secondary | ICD-10-CM | POA: Insufficient documentation

## 2024-08-16 DIAGNOSIS — I25118 Atherosclerotic heart disease of native coronary artery with other forms of angina pectoris: Secondary | ICD-10-CM | POA: Insufficient documentation

## 2024-08-16 DIAGNOSIS — E1165 Type 2 diabetes mellitus with hyperglycemia: Secondary | ICD-10-CM | POA: Diagnosis present

## 2024-08-16 DIAGNOSIS — R0609 Other forms of dyspnea: Secondary | ICD-10-CM | POA: Insufficient documentation

## 2024-08-16 MED ORDER — FENOFIBRATE 54 MG PO TABS: 54.0000 mg | ORAL_TABLET | Freq: Every day | ORAL | 3 refills | Status: AC

## 2024-08-16 NOTE — Progress Notes (Signed)
 Cardiology Office Note:    Date:  08/16/2024   ID:  Peter Garrison, DOB 1960/11/05, MRN [id number]  PCP:  Alvia Toribio Redbird, PA-C  Cardiologist:  Lamar Fitch, MD    Referring MD: Alvia Toribio Redbird, *   Chief Complaint  Patient presents with   Follow-up    History of Present Illness:    Peter Garrison is a 63 y.o. male  past medical history significant for coronary artery disease he did have coronary bypass graft done in 2010 with SVG to LAD D, SVG to RCA, SVG to obtuse marginal branch additional problem include morbid obesity, essential hypertension, dyslipidemia, diabetes. Last cardiac catheterization done in summer 2022 which showed completely occluded saphenous graft going to circumflex obtuse marginal, however since it was a late presentation no intervention has been performed.  Comes today to my office after not being seen almost 4-year, he is doing well.  Denies have any chest pain tightness squeezing pressure burning chest.  He does get some shortness of breath with exertion.  No palpitation dizziness minimal swelling of lower extremities at evening time.  Past Medical History:  Diagnosis Date   Cancer (HCC)    Diabetes mellitus without complication (HCC)    Hypertension    MI (myocardial infarction) (HCC)     Past Surgical History:  Procedure Laterality Date   APPENDECTOMY     CORONARY ARTERY BYPASS GRAFT     KIDNEY SURGERY     KNEE SURGERY     LEFT HEART CATH AND CORS/GRAFTS ANGIOGRAPHY N/A 04/22/2021   Procedure: LEFT HEART CATH AND CORS/GRAFTS ANGIOGRAPHY;  Surgeon: Claudene Victory ORN, MD;  Location: [facility];  Service: Cardiovascular;  Laterality: N/A;    Current Medications: Current Meds  Medication Sig   amLODipine  (NORVASC ) 5 MG tablet Take 1 tablet (5 mg total) by mouth 2 (two) times daily. Patient needs an appointment for further refills. 2 nd attempt   Ascorbic Acid (VITAMIN C PO) Take 1 tablet by mouth daily. Unknown strenght   aspirin  EC 81  MG tablet Take 81 mg by mouth every evening. Swallow whole.   busPIRone  (BUSPAR ) 15 MG tablet Take 15 mg by mouth 2 (two) times daily.   carvedilol  (COREG ) 12.5 MG tablet Take 1 tablet (12.5 mg total) by mouth 2 (two) times daily with a meal. Patient needs an appointment for further refills. 2 nd attempt   Cholecalciferol 50 MCG (2000 UT) TABS Take 1 tablet by mouth daily.   famotidine (PEPCID) 20 MG tablet Take 20 mg by mouth every evening.   fenofibrate  54 MG tablet Take 1 tablet by mouth once daily   glipiZIDE (GLUCOTROL) 10 MG tablet Take 10 mg by mouth every other day.   HYDROcodone -acetaminophen  (NORCO) 10-325 MG tablet Take 1 tablet by mouth 4 (four) times daily as needed for pain.   isosorbide  mononitrate (IMDUR ) 120 MG 24 hr tablet Take 1 tablet by mouth once daily   JARDIANCE 25 MG TABS tablet Take 25 mg by mouth every morning.   Krill Oil 1000 MG CAPS Take 1,000 mg by mouth every evening.   lisinopril  (PRINIVIL ,ZESTRIL ) 5 MG tablet Take 5 mg by mouth daily.   loratadine  (CLARITIN ) 10 MG tablet Take 10 mg by mouth every evening.   nitroGLYCERIN  (NITROSTAT ) 0.4 MG SL tablet Place 1 tablet (0.4 mg total) under the tongue as needed for chest pain.   OZEMPIC, 1 MG/DOSE, 4 MG/3ML SOPN Inject 1 mg into the skin once a week. Mondays  pantoprazole  (PROTONIX ) 40 MG tablet Take 40 mg by mouth every evening.   ranolazine  (RANEXA ) 500 MG 12 hr tablet Take 1 tablet (500 mg total) by mouth 2 (two) times daily.   rosuvastatin  (CRESTOR ) 40 MG tablet Take 1 tablet (40 mg total) by mouth daily.   ticagrelor  (BRILINTA ) 90 MG TABS tablet Take 90 mg by mouth 2 (two) times daily.   zinc gluconate 50 MG tablet Take 1 tablet by mouth daily.     Allergies:   Adhesive [tape], Percocet [oxycodone-acetaminophen ], and Prednisone   Social History   Socioeconomic History   Marital status: Married    Spouse name: Not on file   Number of children: Not on file   Years of education: Not on file   Highest  education level: Not on file  Occupational History   Not on file  Tobacco Use   Smoking status: Former   Smokeless tobacco: Current    Types: Snuff  Vaping Use   Vaping status: Never Used  Substance and Sexual Activity   Alcohol use: Not Currently   Drug use: Never   Sexual activity: Not on file  Other Topics Concern   Not on file  Social History Narrative   Not on file   Social Drivers of Health   Financial Resource Strain: Not on file  Food Insecurity: Not on file  Transportation Needs: Not on file  Physical Activity: Not on file  Stress: Not on file  Social Connections: Not on file     Family History: The patient's family history includes Aneurysm in his mother; Cancer in his father; Diabetes in his mother; Heart attack in his brother and paternal uncle; Heart disease in his brother. ROS:   Please see the history of present illness.    All 14 point review of systems negative except as described per history of present illness  EKGs/Labs/Other Studies Reviewed:    EKG Interpretation Date/Time:  Friday August 16 2024 08:17:12 EST Ventricular Rate:  61 PR Interval:  170 QRS Duration:  94 QT Interval:  508 QTC Calculation: 511 R Axis:   10  Text Interpretation: Normal sinus rhythm Inferior-posterior infarct (cited on or before 24-Apr-2021) When compared with ECG of 24-Apr-2021 09:58, Premature ventricular complexes are no longer Present QRS axis Shifted right Nonspecific T wave abnormality now evident in Inferior leads Nonspecific T wave abnormality now evident in Anterolateral leads QT has lengthened Confirmed by Bernie Charleston [id number]) on 08/16/2024 8:18:16 AM    Recent Labs: No results found for requested labs within last 365 days.  Recent Lipid Panel    Component Value Date/Time   CHOL 159 04/11/2023 0917   TRIG 259 (H) 04/11/2023 0917   HDL 39 (L) 04/11/2023 0917   CHOLHDL 4.1 04/11/2023 0917   CHOLHDL 4.2 04/23/2021 0604   VLDL 32 04/23/2021 0604    LDLCALC 78 04/11/2023 0917    Physical Exam:    VS:  BP 110/64   Ht 5' 7 (1.702 m)   Wt 267 lb (121.1 kg)   SpO2 97%   BMI 41.82 kg/m     Wt Readings from Last 3 Encounters:  08/16/24 267 lb (121.1 kg)  04/11/23 276 lb (125.2 kg)  05/24/22 276 lb 3.2 oz (125.3 kg)     GEN:  Well nourished, well developed in no acute distress HEENT: Normal NECK: No JVD; No carotid bruits LYMPHATICS: No lymphadenopathy CARDIAC: RRR, no murmurs, no rubs, no gallops RESPIRATORY:  Clear to auscultation without rales, wheezing or  rhonchi  ABDOMEN: Soft, non-tender, non-distended MUSCULOSKELETAL:  No edema; No deformity  SKIN: Warm and dry LOWER EXTREMITIES: no swelling NEUROLOGIC:  Alert and oriented x 3 PSYCHIATRIC:  Normal affect   ASSESSMENT:    1. Coronary artery disease of native artery of native heart with stable angina pectoris   2. Essential (primary) hypertension   3. Type 2 diabetes mellitus with hyperglycemia, without long-term current use of insulin  (HCC)   4. S/P CABG (coronary artery bypass graft)    PLAN:    In order of problems listed above:  Coronary disease stable from that point review on guideline directed medical therapy asymptomatic continue present management. Essential hypertension blood pressure well-controlled continue present management. Type 2 diabetes he said his diabetes much better controlled I did review blood test done by primary care physician from May of this year he is hemoglobin A1c is 6.5. Dyslipidemia: On high intensity statin in form of Crestor  40 mg daily, he is LDL 62 HDL 39 we will continue. Dyspnea on exertion will repeat echocardiogram.   Medication Adjustments/Labs and Tests Ordered: Current medicines are reviewed at length with the patient today.  Concerns regarding medicines are outlined above.  Orders Placed This Encounter  Procedures   EKG 12-Lead   Medication changes: No orders of the defined types were placed in this  encounter.   Signed, Lamar DOROTHA Fitch, MD, [facility] 08/16/2024 8:26 AM    [facility]

## 2024-08-16 NOTE — Patient Instructions (Addendum)
 Medication Instructions:  Your physician recommends that you continue on your current medications as directed. Please refer to the Current Medication list given to you today.  *If you need a refill on your cardiac medications before your next appointment, please call your pharmacy*   Lab Work: None Ordered If you have labs (blood work) drawn today and your tests are completely normal, you will receive your results only by: MyChart Message (if you have MyChart) OR A paper copy in the mail If you have any lab test that is abnormal or we need to change your treatment, we will call you to review the results.   Testing/Procedures: Your physician has requested that you have an echocardiogram. Echocardiography is a painless test that uses sound waves to create images of your heart. It provides your doctor with information about the size and shape of your heart and how well your heart's chambers and valves are working. This procedure takes approximately one hour. There are no restrictions for this procedure. Please do NOT wear cologne, perfume, aftershave, or lotions (deodorant is allowed). Please arrive 15 minutes prior to your appointment time.  Please note: We ask at that you not bring children with you during ultrasound (echo/ vascular) testing. Due to room size and safety concerns, children are not allowed in the ultrasound rooms during exams. Our front office staff cannot provide observation of children in our lobby area while testing is being conducted. An adult accompanying a patient to their appointment will only be allowed in the ultrasound room at the discretion of the ultrasound technician under special circumstances. We apologize for any inconvenience.    Follow-Up: At [facility], you and your health needs are our priority.  As part of our continuing mission to provide you with exceptional heart care, we have created designated Provider Care Teams.  These Care Teams include your  primary Cardiologist (physician) and Advanced Practice Providers (APPs -  Physician Assistants and Nurse Practitioners) who all work together to provide you with the care you need, when you need it.  We recommend signing up for the patient portal called MyChart.  Sign up information is provided on this After Visit Summary.  MyChart is used to connect with patients for Virtual Visits (Telemedicine).  Patients are able to view lab/test results, encounter notes, upcoming appointments, etc.  Non-urgent messages can be sent to your provider as well.   To learn more about what you can do with MyChart, go to [email or web address].    Your next appointment:   12 month(s)  The format for your next appointment:   In Person  Provider:   Lamar Fitch, MD    Other Instructions NA

## 2024-08-26 ENCOUNTER — Other Ambulatory Visit: Payer: Self-pay | Admitting: Cardiology

## 2024-09-07 ENCOUNTER — Other Ambulatory Visit: Payer: Self-pay | Admitting: Cardiology

## 2024-09-12 ENCOUNTER — Ambulatory Visit: Attending: Cardiology

## 2024-09-12 DIAGNOSIS — R0609 Other forms of dyspnea: Secondary | ICD-10-CM | POA: Diagnosis not present

## 2024-09-12 LAB — ECHOCARDIOGRAM COMPLETE
Area-P 1/2: 4.07 cm2
S' Lateral: 3.4 cm

## 2024-09-16 ENCOUNTER — Ambulatory Visit: Payer: Self-pay | Admitting: Cardiology

## 2024-09-17 ENCOUNTER — Telehealth: Payer: Self-pay

## 2024-09-17 NOTE — Telephone Encounter (Signed)
 Left message on My Chart with Echo results per Dr. Karry note. Routed to PCP.

## 2024-09-18 ENCOUNTER — Telehealth: Payer: Self-pay

## 2024-09-18 NOTE — Telephone Encounter (Signed)
 Pt viewed Echo results on My Chart per Dr. Vanetta Shawl note. Routed to PCP.

## 2024-10-08 ENCOUNTER — Other Ambulatory Visit: Payer: Self-pay | Admitting: Cardiology

## 2024-11-08 ENCOUNTER — Telehealth: Payer: Self-pay | Admitting: Cardiology

## 2024-11-08 NOTE — Telephone Encounter (Signed)
" °*  STAT* If patient is at the pharmacy, call can be transferred to refill team.   1. Which medications need to be refilled? (please list name of each medication and dose if known)   ticagrelor  (BRILINTA ) 90 MG TABS tablet    2. Would you like to learn more about the convenience, safety, & potential cost savings by using the [facility]? No    3. Are you open to using the [facility] (Type [facility]. No    4. Which pharmacy/location (including street and city if local pharmacy) is medication to be sent to?  [id number] - [place], [place] - [address]     5. Do they need a 30 day or 90 day supply? 90 day   "

## 2024-11-13 NOTE — Telephone Encounter (Signed)
 Spoke with pt.  Per pt, he said him and Dr. Krasowski and every time he has taken Plavix, he has been back in the hospital with heart attachks.  Per pt, wants to stay on Brilinta .  Please advise!

## 2024-11-15 ENCOUNTER — Other Ambulatory Visit: Payer: Self-pay

## 2024-11-15 MED ORDER — TICAGRELOR 90 MG PO TABS: 90.0000 mg | ORAL_TABLET | Freq: Two times a day (BID) | ORAL | 3 refills | Status: AC

## 2024-11-15 NOTE — Telephone Encounter (Signed)
 Brilinta  90mg  1 tablet twice daily #180 ref x 3 Sent to [facility]
